# Patient Record
Sex: Male | Born: 1945 | Race: White | Marital: Married | Smoking: Former smoker
Health system: Southern US, Community
[De-identification: ages and names within clinical notes are randomized; demographics above are authoritative.]

## PROBLEM LIST (undated history)

## (undated) DIAGNOSIS — E785 Hyperlipidemia, unspecified: Secondary | ICD-10-CM

## (undated) DIAGNOSIS — C801 Malignant (primary) neoplasm, unspecified: Secondary | ICD-10-CM

## (undated) HISTORY — DX: Malignant (primary) neoplasm, unspecified: C80.1

## (undated) HISTORY — PX: POLYPECTOMY: SHX149

## (undated) HISTORY — PX: NO PAST SURGERIES: SHX2092

## (undated) HISTORY — PX: INGUINAL HERNIA REPAIR: SHX194

## (undated) HISTORY — DX: Hyperlipidemia, unspecified: E78.5

---

## 1967-05-12 HISTORY — PX: WOUND DEBRIDEMENT: SHX247

## 2012-05-11 DIAGNOSIS — C801 Malignant (primary) neoplasm, unspecified: Secondary | ICD-10-CM

## 2012-05-11 HISTORY — PX: BLADDER TUMOR EXCISION: SHX238

## 2012-05-11 HISTORY — DX: Malignant (primary) neoplasm, unspecified: C80.1

## 2014-04-26 ENCOUNTER — Ambulatory Visit (INDEPENDENT_AMBULATORY_CARE_PROVIDER_SITE_OTHER): Payer: Medicare Other

## 2014-04-26 DIAGNOSIS — M778 Other enthesopathies, not elsewhere classified: Secondary | ICD-10-CM

## 2014-04-26 DIAGNOSIS — M779 Enthesopathy, unspecified: Secondary | ICD-10-CM

## 2014-04-26 DIAGNOSIS — R52 Pain, unspecified: Secondary | ICD-10-CM

## 2014-04-26 DIAGNOSIS — M722 Plantar fascial fibromatosis: Secondary | ICD-10-CM

## 2014-04-26 DIAGNOSIS — M7750 Other enthesopathy of unspecified foot: Secondary | ICD-10-CM

## 2014-04-26 MED ORDER — MELOXICAM 15 MG PO TABS
15.0000 mg | ORAL_TABLET | Freq: Every day | ORAL | Status: DC
Start: 2014-04-26 — End: 2019-10-10

## 2014-04-26 NOTE — Progress Notes (Signed)
   Subjective:    Patient ID: Louis Carrillo, male    DOB: 1945-10-20, 68 y.o.   MRN: [id number]  HPI PT STATED B/L TOP AND BOTTOM OF THE FOOT HAVING BURNING AND TINGLING SENSATION FOR 3 WEEKS. FEET ARE GETTING BETTER BUT LAST NIGHT START GETTING WORSE AGAIN. THE FOOT GET AGGRAVATED BY SITTING/WALKING. TRIED NO TREATMENT.   Review of Systems  HENT: Positive for sinus pressure.   Hematological: Bruises/bleeds easily.  All other systems reviewed and are negative.      Objective:   Physical Exam 68 year old white male well-developed well-nourished oriented 3 presents at this time with a recent history of re-exacerbation of his foot pain has pain and arch with aching and throbbing at times sometimes a sharp pain points to mid arch area of both feet left more so than right also down to the heel areas well on palpation there is pain in the mid band plantar fascia from sesamoid always the inferior calcaneal tubercle area left more so than right. Lower extremity objective findings reveal vascular status to be intact pedal pulses palpable DP +2 PT plus one over 4 capillary refill time 3 seconds all digits epicritic and proprioceptive sensations intact and symmetric bilateral there is normal plantar response DTRs not listed dermatologic the skin color pigment normal hair growth absent nails criptotic incurvated and ankle exam there is pain on palpation medial band plantar fascia medial calcaneal tubercle x-rays reveal no inferior calcaneal spurring mild fascial thickening or minimal osteophyte identified rectus foot otherwise noted mild subluxation Lisfranc's fourth fifth metatarsal base and cuboid.       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 At this time discussed follow-up with new orthoses however this time fascial strapping applied to both feet  prescription for Modic's or  meloxicam is given at this time 15 mg , once daily forwarded to [facility] drug. Patient be recheck in 2-3 weeks for follow-up maintain a firm stable shoe no barefoot no flimsy shoes or flip-flops will maintain the use of his orthoses which may need to be refurbished are replaced in the future.    Harriet Masson DPM

## 2014-04-26 NOTE — Patient Instructions (Signed)

## 2014-05-14 ENCOUNTER — Ambulatory Visit (INDEPENDENT_AMBULATORY_CARE_PROVIDER_SITE_OTHER): Payer: Self-pay

## 2014-05-14 ENCOUNTER — Ambulatory Visit (INDEPENDENT_AMBULATORY_CARE_PROVIDER_SITE_OTHER): Payer: Medicare Other

## 2014-05-14 VITALS — BP 147/89 | HR 74 | Resp 12

## 2014-05-14 DIAGNOSIS — R52 Pain, unspecified: Secondary | ICD-10-CM

## 2014-05-14 DIAGNOSIS — S93402A Sprain of unspecified ligament of left ankle, initial encounter: Secondary | ICD-10-CM

## 2014-05-14 NOTE — Progress Notes (Signed)
   Subjective:    Patient ID: Louis Carrillo, male    DOB: [birth year], 69 y.o.   MRN: [id number]  HPI NEW PROBLEM:  PT STATED TWISTED THE LT FOOT ANKLE BY WALKING AROUND THE OLD HOUSE YESTERDAY AND STILL PAINFUL TO WALK ON IT. THE FOOT IS A LITTLE BETTER TODAY. TRIED WEARING ACE BANDAGE, ICE, AND SOAK AND IT HELP SOME.    Review of Systems  All other systems reviewed and are negative.      Objective:   Physical Exam Neurovascular status is intact and unchanged his fasciitis is greatly improved with the taping and strapping and proper shoes however the other day he turned his ankle 3 times a walking around his yard on some uneven ground. Patient continues to have pain in the anterior talofibular and calcaneofibular distribution inferior and anterior to the lateral malleolus on the left ankle. Mild ecchymosis is noted x-rays revealed generalized osteopenia no fracture no cysts no tumors no displacements or dislocations noted rectus foot type mild inferior calcaneal spurs or identified. Fascial thickening is still noted.       Assessment & Plan:  Assessment this time a lateral ankle sprain left ankle plan at this time ankle stabilizer is applied maintain advised profound or Tylenol as needed for pain recommended ice to the ankle area as well maintain stabilizer from anywhere from 4 weeks up to 2 or 3 months as needed to resolve the ankle sprain all up in a month if not improving  Harriet Masson DPM

## 2014-05-14 NOTE — Patient Instructions (Signed)
ICE INSTRUCTIONS  Apply ice or cold pack to the affected area at least 3 times a day for 10-15 minutes each time.  You should also use ice after prolonged activity or vigorous exercise.  Do not apply ice longer than 20 minutes at one time.  Always keep a cloth between your skin and the ice pack to prevent burns.  Being consistent and following these instructions will help control your symptoms.  We suggest you purchase a gel ice pack because they are reusable and do bit leak.  Some of them are designed to wrap around the area.  Use the method that works best for you.  Here are some other suggestions for icing.   Use a frozen bag of peas or corn-inexpensive and molds well to your body, usually stays frozen for 10 to 20 minutes.  Wet a towel with cold water and squeeze out the excess until it's damp.  Place in a bag in the freezer for 20 minutes. Then remove and use.   Maintaining ankle stabilizer for at least 4 weeks possibly even up to 2 or 3 months if needed

## 2014-12-24 HISTORY — PX: COLONOSCOPY: SHX174

## 2015-05-31 DIAGNOSIS — C44519 Basal cell carcinoma of skin of other part of trunk: Secondary | ICD-10-CM | POA: Diagnosis not present

## 2015-05-31 DIAGNOSIS — L570 Actinic keratosis: Secondary | ICD-10-CM | POA: Diagnosis not present

## 2015-05-31 DIAGNOSIS — L821 Other seborrheic keratosis: Secondary | ICD-10-CM | POA: Diagnosis not present

## 2015-05-31 DIAGNOSIS — L578 Other skin changes due to chronic exposure to nonionizing radiation: Secondary | ICD-10-CM | POA: Diagnosis not present

## 2015-06-27 DIAGNOSIS — J309 Allergic rhinitis, unspecified: Secondary | ICD-10-CM | POA: Diagnosis not present

## 2015-06-27 DIAGNOSIS — J069 Acute upper respiratory infection, unspecified: Secondary | ICD-10-CM | POA: Diagnosis not present

## 2015-09-24 DIAGNOSIS — C672 Malignant neoplasm of lateral wall of bladder: Secondary | ICD-10-CM | POA: Diagnosis not present

## 2015-09-24 DIAGNOSIS — N3020 Other chronic cystitis without hematuria: Secondary | ICD-10-CM | POA: Diagnosis not present

## 2015-09-24 DIAGNOSIS — N401 Enlarged prostate with lower urinary tract symptoms: Secondary | ICD-10-CM | POA: Diagnosis not present

## 2015-09-24 DIAGNOSIS — R351 Nocturia: Secondary | ICD-10-CM | POA: Diagnosis not present

## 2015-10-17 DIAGNOSIS — R05 Cough: Secondary | ICD-10-CM | POA: Diagnosis not present

## 2016-04-07 DIAGNOSIS — N3000 Acute cystitis without hematuria: Secondary | ICD-10-CM | POA: Diagnosis not present

## 2016-04-07 DIAGNOSIS — C672 Malignant neoplasm of lateral wall of bladder: Secondary | ICD-10-CM | POA: Diagnosis not present

## 2016-04-07 DIAGNOSIS — N401 Enlarged prostate with lower urinary tract symptoms: Secondary | ICD-10-CM | POA: Diagnosis not present

## 2016-04-07 DIAGNOSIS — R338 Other retention of urine: Secondary | ICD-10-CM | POA: Diagnosis not present

## 2016-04-25 DIAGNOSIS — B029 Zoster without complications: Secondary | ICD-10-CM | POA: Diagnosis not present

## 2016-04-27 DIAGNOSIS — H25813 Combined forms of age-related cataract, bilateral: Secondary | ICD-10-CM | POA: Diagnosis not present

## 2016-04-27 DIAGNOSIS — I10 Essential (primary) hypertension: Secondary | ICD-10-CM | POA: Diagnosis not present

## 2016-04-27 DIAGNOSIS — H524 Presbyopia: Secondary | ICD-10-CM | POA: Diagnosis not present

## 2016-05-06 DIAGNOSIS — K1379 Other lesions of oral mucosa: Secondary | ICD-10-CM | POA: Diagnosis not present

## 2016-05-20 DIAGNOSIS — C672 Malignant neoplasm of lateral wall of bladder: Secondary | ICD-10-CM | POA: Diagnosis not present

## 2016-05-20 DIAGNOSIS — N401 Enlarged prostate with lower urinary tract symptoms: Secondary | ICD-10-CM | POA: Diagnosis not present

## 2016-05-20 DIAGNOSIS — N3020 Other chronic cystitis without hematuria: Secondary | ICD-10-CM | POA: Diagnosis not present

## 2016-05-20 DIAGNOSIS — N318 Other neuromuscular dysfunction of bladder: Secondary | ICD-10-CM | POA: Diagnosis not present

## 2016-05-20 DIAGNOSIS — R351 Nocturia: Secondary | ICD-10-CM | POA: Diagnosis not present

## 2016-06-01 DIAGNOSIS — L570 Actinic keratosis: Secondary | ICD-10-CM | POA: Diagnosis not present

## 2016-06-01 DIAGNOSIS — C44519 Basal cell carcinoma of skin of other part of trunk: Secondary | ICD-10-CM | POA: Diagnosis not present

## 2016-06-01 DIAGNOSIS — L578 Other skin changes due to chronic exposure to nonionizing radiation: Secondary | ICD-10-CM | POA: Diagnosis not present

## 2016-06-01 DIAGNOSIS — L821 Other seborrheic keratosis: Secondary | ICD-10-CM | POA: Diagnosis not present

## 2016-08-26 DIAGNOSIS — D485 Neoplasm of uncertain behavior of skin: Secondary | ICD-10-CM | POA: Diagnosis not present

## 2016-08-26 DIAGNOSIS — Z85828 Personal history of other malignant neoplasm of skin: Secondary | ICD-10-CM | POA: Diagnosis not present

## 2016-08-26 DIAGNOSIS — L821 Other seborrheic keratosis: Secondary | ICD-10-CM | POA: Diagnosis not present

## 2016-08-26 DIAGNOSIS — D0439 Carcinoma in situ of skin of other parts of face: Secondary | ICD-10-CM | POA: Diagnosis not present

## 2016-08-26 DIAGNOSIS — L570 Actinic keratosis: Secondary | ICD-10-CM | POA: Diagnosis not present

## 2016-08-26 DIAGNOSIS — L738 Other specified follicular disorders: Secondary | ICD-10-CM | POA: Diagnosis not present

## 2016-09-11 DIAGNOSIS — J189 Pneumonia, unspecified organism: Secondary | ICD-10-CM | POA: Diagnosis not present

## 2016-09-15 DIAGNOSIS — C44329 Squamous cell carcinoma of skin of other parts of face: Secondary | ICD-10-CM | POA: Diagnosis not present

## 2016-09-15 DIAGNOSIS — Z85828 Personal history of other malignant neoplasm of skin: Secondary | ICD-10-CM | POA: Diagnosis not present

## 2016-09-21 DIAGNOSIS — Z6829 Body mass index (BMI) 29.0-29.9, adult: Secondary | ICD-10-CM | POA: Diagnosis not present

## 2016-09-21 DIAGNOSIS — M545 Low back pain: Secondary | ICD-10-CM | POA: Diagnosis not present

## 2016-10-01 DIAGNOSIS — Z6829 Body mass index (BMI) 29.0-29.9, adult: Secondary | ICD-10-CM | POA: Diagnosis not present

## 2016-10-01 DIAGNOSIS — Z125 Encounter for screening for malignant neoplasm of prostate: Secondary | ICD-10-CM | POA: Diagnosis not present

## 2016-10-01 DIAGNOSIS — Z0000 Encounter for general adult medical examination without abnormal findings: Secondary | ICD-10-CM | POA: Diagnosis not present

## 2016-10-01 DIAGNOSIS — Z1389 Encounter for screening for other disorder: Secondary | ICD-10-CM | POA: Diagnosis not present

## 2016-10-01 DIAGNOSIS — Z9181 History of falling: Secondary | ICD-10-CM | POA: Diagnosis not present

## 2016-10-01 DIAGNOSIS — Z1159 Encounter for screening for other viral diseases: Secondary | ICD-10-CM | POA: Diagnosis not present

## 2016-10-29 DIAGNOSIS — Z85828 Personal history of other malignant neoplasm of skin: Secondary | ICD-10-CM | POA: Diagnosis not present

## 2016-10-29 DIAGNOSIS — D485 Neoplasm of uncertain behavior of skin: Secondary | ICD-10-CM | POA: Diagnosis not present

## 2016-10-29 DIAGNOSIS — C4442 Squamous cell carcinoma of skin of scalp and neck: Secondary | ICD-10-CM | POA: Diagnosis not present

## 2017-02-05 DIAGNOSIS — J209 Acute bronchitis, unspecified: Secondary | ICD-10-CM | POA: Diagnosis not present

## 2017-03-29 DIAGNOSIS — R351 Nocturia: Secondary | ICD-10-CM | POA: Diagnosis not present

## 2017-03-29 DIAGNOSIS — N401 Enlarged prostate with lower urinary tract symptoms: Secondary | ICD-10-CM | POA: Diagnosis not present

## 2017-03-29 DIAGNOSIS — N3020 Other chronic cystitis without hematuria: Secondary | ICD-10-CM | POA: Diagnosis not present

## 2017-03-29 DIAGNOSIS — C672 Malignant neoplasm of lateral wall of bladder: Secondary | ICD-10-CM | POA: Diagnosis not present

## 2017-07-10 DIAGNOSIS — H1045 Other chronic allergic conjunctivitis: Secondary | ICD-10-CM | POA: Diagnosis not present

## 2017-07-10 DIAGNOSIS — J069 Acute upper respiratory infection, unspecified: Secondary | ICD-10-CM | POA: Diagnosis not present

## 2017-08-26 DIAGNOSIS — D485 Neoplasm of uncertain behavior of skin: Secondary | ICD-10-CM | POA: Diagnosis not present

## 2017-08-26 DIAGNOSIS — C44311 Basal cell carcinoma of skin of nose: Secondary | ICD-10-CM | POA: Diagnosis not present

## 2017-08-26 DIAGNOSIS — L821 Other seborrheic keratosis: Secondary | ICD-10-CM | POA: Diagnosis not present

## 2017-08-26 DIAGNOSIS — Z85828 Personal history of other malignant neoplasm of skin: Secondary | ICD-10-CM | POA: Diagnosis not present

## 2017-08-26 DIAGNOSIS — L570 Actinic keratosis: Secondary | ICD-10-CM | POA: Diagnosis not present

## 2018-03-28 DIAGNOSIS — N318 Other neuromuscular dysfunction of bladder: Secondary | ICD-10-CM | POA: Diagnosis not present

## 2018-03-28 DIAGNOSIS — C672 Malignant neoplasm of lateral wall of bladder: Secondary | ICD-10-CM | POA: Diagnosis not present

## 2018-03-28 DIAGNOSIS — N400 Enlarged prostate without lower urinary tract symptoms: Secondary | ICD-10-CM | POA: Diagnosis not present

## 2018-04-18 DIAGNOSIS — J069 Acute upper respiratory infection, unspecified: Secondary | ICD-10-CM | POA: Diagnosis not present

## 2018-09-19 DIAGNOSIS — L821 Other seborrheic keratosis: Secondary | ICD-10-CM | POA: Diagnosis not present

## 2018-09-19 DIAGNOSIS — L570 Actinic keratosis: Secondary | ICD-10-CM | POA: Diagnosis not present

## 2018-09-19 DIAGNOSIS — Z85828 Personal history of other malignant neoplasm of skin: Secondary | ICD-10-CM | POA: Diagnosis not present

## 2018-09-19 DIAGNOSIS — C44629 Squamous cell carcinoma of skin of left upper limb, including shoulder: Secondary | ICD-10-CM | POA: Diagnosis not present

## 2018-09-19 DIAGNOSIS — D1801 Hemangioma of skin and subcutaneous tissue: Secondary | ICD-10-CM | POA: Diagnosis not present

## 2018-09-19 DIAGNOSIS — D485 Neoplasm of uncertain behavior of skin: Secondary | ICD-10-CM | POA: Diagnosis not present

## 2018-09-19 DIAGNOSIS — C44622 Squamous cell carcinoma of skin of right upper limb, including shoulder: Secondary | ICD-10-CM | POA: Diagnosis not present

## 2019-01-23 ENCOUNTER — Emergency Department (HOSPITAL_COMMUNITY): Payer: PPO

## 2019-01-23 ENCOUNTER — Emergency Department (HOSPITAL_COMMUNITY)
Admission: EM | Admit: 2019-01-23 | Discharge: 2019-01-23 | Disposition: A | Discharge status: Home / Self Care | Payer: PPO | Attending: Emergency Medicine | Admitting: Emergency Medicine

## 2019-01-23 ENCOUNTER — Other Ambulatory Visit: Payer: Self-pay

## 2019-01-23 DIAGNOSIS — Z79899 Other long term (current) drug therapy: Secondary | ICD-10-CM | POA: Insufficient documentation

## 2019-01-23 DIAGNOSIS — I10 Essential (primary) hypertension: Secondary | ICD-10-CM | POA: Insufficient documentation

## 2019-01-23 DIAGNOSIS — R42 Dizziness and giddiness: Secondary | ICD-10-CM | POA: Diagnosis not present

## 2019-01-23 DIAGNOSIS — R51 Headache: Secondary | ICD-10-CM | POA: Diagnosis not present

## 2019-01-23 LAB — COMPREHENSIVE METABOLIC PANEL
ALT: 19 U/L (ref 0–44)
AST: 16 U/L (ref 15–41)
Albumin: 3.9 g/dL (ref 3.5–5.0)
Alkaline Phosphatase: 71 U/L (ref 38–126)
Anion gap: 7 (ref 5–15)
BUN: 14 mg/dL (ref 8–23)
CO2: 28 mmol/L (ref 22–32)
Calcium: 9.1 mg/dL (ref 8.9–10.3)
Chloride: 103 mmol/L (ref 98–111)
Creatinine, Ser: 0.91 mg/dL (ref 0.61–1.24)
GFR calc Af Amer: >60 mL/min (ref >60)
GFR calc non Af Amer: >60 mL/min (ref >60)
Glucose, Bld: 112 mg/dL — ABNORMAL HIGH (ref 70–99)
Potassium: 4.4 mmol/L (ref 3.5–5.1)
Sodium: 138 mmol/L (ref 135–145)
Total Bilirubin: 0.8 mg/dL (ref 0.3–1.2)
Total Protein: 6.7 g/dL (ref 6.5–8.1)

## 2019-01-23 LAB — CBC
HCT: 44.9 % (ref 39.0–52.0)
Hemoglobin: 14.8 g/dL (ref 13.0–17.0)
MCH: 30.5 pg (ref 26.0–34.0)
MCHC: 33 g/dL (ref 30.0–36.0)
MCV: 92.4 fL (ref 80.0–100.0)
Platelets: 171 10*3/uL (ref 150–400)
RBC: 4.86 MIL/uL (ref 4.22–5.81)
RDW: 13.1 % (ref 11.5–15.5)
WBC: 5.3 10*3/uL (ref 4.0–10.5)
nRBC: 0 % (ref 0.0–0.2)

## 2019-01-23 LAB — DIFFERENTIAL
Abs Immature Granulocytes: 0.03 10*3/uL (ref 0.00–0.07)
Basophils Absolute: 0 10*3/uL (ref 0.0–0.1)
Basophils Relative: 1 %
Eosinophils Absolute: 0.1 10*3/uL (ref 0.0–0.5)
Eosinophils Relative: 2 %
Immature Granulocytes: 1 %
Lymphocytes Relative: 11 %
Lymphs Abs: 0.6 10*3/uL — ABNORMAL LOW (ref 0.7–4.0)
Monocytes Absolute: 0.4 10*3/uL (ref 0.1–1.0)
Monocytes Relative: 8 %
Neutro Abs: 4.1 10*3/uL (ref 1.7–7.7)
Neutrophils Relative %: 77 %

## 2019-01-23 LAB — URINALYSIS, ROUTINE W REFLEX MICROSCOPIC
Bilirubin Urine: NEGATIVE
Glucose, UA: NEGATIVE mg/dL
Hgb urine dipstick: NEGATIVE
Ketones, ur: NEGATIVE mg/dL
Leukocytes,Ua: NEGATIVE
Nitrite: NEGATIVE
Protein, ur: NEGATIVE mg/dL
Specific Gravity, Urine: 1.01 (ref 1.005–1.030)
pH: 8 (ref 5.0–8.0)

## 2019-01-23 LAB — CBG MONITORING, ED: Glucose-Capillary: 107 mg/dL — ABNORMAL HIGH (ref 70–99)

## 2019-01-23 MED ORDER — MECLIZINE HCL 12.5 MG PO TABS: 12.5000 mg | ORAL_TABLET | Freq: Three times a day (TID) | ORAL | 0 refills | Status: DC | PRN

## 2019-01-23 MED ORDER — SODIUM CHLORIDE 0.9 % IV BOLUS
1000.0000 mL | Freq: Once | INTRAVENOUS | Status: AC
  Administered 2019-01-23: 1000 mL via INTRAVENOUS

## 2019-01-23 NOTE — ED Notes (Signed)
While ambulating the pt, pt stated that he did NOT experience any dizziness/lightheadedness. Pt stated that he felt "fine". Pt's HR was between 79-86 and O2 was 99%-100%.

## 2019-01-23 NOTE — ED Notes (Signed)
Patient Alert and oriented to baseline. Stable and ambulatory to baseline. Patient verbalized understanding of the discharge instructions.  Patient belongings were taken by the patient.   

## 2019-01-23 NOTE — ED Provider Notes (Signed)
[facility] EMERGENCY DEPARTMENT Provider Note   CSN: [id number] Arrival date & time: 01/23/19  0945     History   Chief Complaint Chief Complaint  Patient presents with   Dizziness    HPI Louis Carrillo is a 73 y.o. male with PMHx HTN who presents to the ED today complaining of sudden onset, intermittent, room spinning dizziness that began at 4 AM this morning.  She reports that he woke up to urinate and noticed that he was dizzy.  He had trouble with his balance when walking but was able to make it to the bathroom and back to bed without difficulty.  Patient again woke up around 5:30 AM and noticed that he was dizzy.  He went to urgent care earlier today sent here for further evaluation given hypertensive.  Blood pressure in the ED 190/101.  Patient states he takes 12.5 mg of losartan daily for his blood pressure.  He did take his medication this morning.  He is also complaining of a slight diffuse headache.  Patient reports history of vertigo about 10 to 15 years ago with room spinning dizziness.  Denies fever, chills, vision changes, unilateral weakness or numbness, congestion, speech difficulties, syncope, chest pain, shortness of breath, abdominal pain, any other associated symptoms.        Past Medical History:  Diagnosis Date   Cancer     There are no active problems to display for this patient.   No past surgical history on file.      Home Medications    Prior to Admission medications   Medication Sig Start Date End Date Taking? Authorizing Provider  meclizine (ANTIVERT) 12.5 MG tablet Take 1 tablet (12.5 mg total) by mouth 3 (three) times daily as needed for dizziness. 01/23/19   Eustaquio Maize, PA-C  meloxicam (MOBIC) 15 MG tablet Take 1 tablet (15 mg total) by mouth daily. 04/26/14   Harriet Masson, DPM  Vit B6-Vit B12-Omega 3 Acids (VITAMIN B PLUS+ PO) Take by mouth.    [provider]    Family History No family history on  file.  Social History Social History   Tobacco Use   Smoking status: Never Smoker  Substance Use Topics   Alcohol use: No   Drug use: No     Allergies   Patient has no known allergies.   Review of Systems Review of Systems  Constitutional: Negative for chills and fever.  HENT: Negative for congestion.   Eyes: Negative for visual disturbance.  Respiratory: Negative for cough and shortness of breath.   Cardiovascular: Negative for chest pain.  Gastrointestinal: Negative for abdominal pain, nausea and vomiting.  Genitourinary: Negative for difficulty urinating.  Musculoskeletal: Negative for myalgias.  Skin: Negative for rash.  Neurological: Positive for dizziness and headaches. Negative for weakness and numbness.     Physical Exam Updated Vital Signs BP (!) 190/101    Pulse 64    Temp 98.5 F (36.9 C) (Oral)    SpO2 100%   Physical Exam Vitals signs and nursing note reviewed.  Constitutional:      Appearance: Normal appearance. He is not ill-appearing.  HENT:     Head: Normocephalic and atraumatic.     Right Ear: Tympanic membrane normal.     Left Ear: Tympanic membrane normal.  Eyes:     Extraocular Movements: Extraocular movements intact.     Conjunctiva/sclera: Conjunctivae normal.     Pupils: Pupils are equal, round, and reactive to light.  Neck:  Musculoskeletal: Normal range of motion and neck supple.  Cardiovascular:     Rate and Rhythm: Normal rate and regular rhythm.     Pulses: Normal pulses.  Pulmonary:     Effort: Pulmonary effort is normal.     Breath sounds: Normal breath sounds. No wheezing, rhonchi or rales.  Abdominal:     Palpations: Abdomen is soft.     Tenderness: There is no abdominal tenderness. There is no guarding or rebound.  Musculoskeletal: Normal range of motion.  Skin:    General: Skin is warm and dry.  Neurological:     Mental Status: He is alert.     Comments: CN 3-12 grossly intact A&O x4 GCS 15 Sensation and  strength intact Coordination with finger-to-nose WNL Neg pronator drift       ED Treatments / Results  Labs (all labs ordered are listed, but only abnormal results are displayed) Labs Reviewed  DIFFERENTIAL - Abnormal; Notable for the following components:      Result Value   Lymphs Abs 0.6 (*)    All other components within normal limits  COMPREHENSIVE METABOLIC PANEL - Abnormal; Notable for the following components:   Glucose, Bld 112 (*)    All other components within normal limits  URINALYSIS, ROUTINE W REFLEX MICROSCOPIC - Abnormal; Notable for the following components:   Color, Urine STRAW (*)    All other components within normal limits  CBG MONITORING, ED - Abnormal; Notable for the following components:   Glucose-Capillary 107 (*)    All other components within normal limits  CBC    EKG EKG Interpretation  Date/Time:  Monday January 23 2019 10:24:08 EDT Ventricular Rate:  68 PR Interval:    QRS Duration: 111 QT Interval:  418 QTC Calculation: 445 R Axis:   -3 Text Interpretation:  Sinus rhythm Short PR interval Baseline wander No previous tracing Confirmed by Lajean Saver [phone]) on 01/23/2019 10:42:12 AM   Radiology Ct Head Wo Contrast  Result Date: 01/23/2019 CLINICAL DATA:  Headache with dizziness and blurred vision EXAM: CT HEAD WITHOUT CONTRAST TECHNIQUE: Contiguous axial images were obtained from the base of the skull through the vertex without intravenous contrast. COMPARISON:  None. FINDINGS: Brain: There is age related volume loss. There is a small cavum septum pellucidum, an anatomic variant. There is no intracranial mass, hemorrhage, extra-axial fluid collection, or midline shift. There is evidence of a prior small infarct in the head of the caudate nucleus on the left. There is patchy small vessel disease in the centra semiovale bilaterally. Brain parenchyma elsewhere appears unremarkable. No acute infarct is evident. Vascular: There is no hyperdense  vessel. There is calcification in each carotid siphon region as well as in the distal left vertebral artery. Skull: The bony calvarium appears intact. Sinuses/orbits: There is mucosal thickening and opacification in multiple ethmoid air cells. There is also opacification in the medial right sphenoid sinus region. Mucosal thickening is noted in the frontal sinus regions. Orbits appear symmetric bilaterally. Other: Visualized mastoid air cells are clear. IMPRESSION: Patchy periventricular small vessel disease. 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 No acute infarct evident. No mass or hemorrhage. Foci of arterial vascular calcification noted. Paranasal sinus disease at several sites noted. Electronically Signed   By: Lowella Grip III M.D.   On: 01/23/2019 11:33    Procedures Procedures (including critical care time)  Medications Ordered in ED Medications  sodium chloride 0.9 % bolus 1,000 mL (1,000 mLs  Intravenous New Bag/Given 01/23/19 1142)     Initial Impression / Assessment and Plan / ED Course  I have reviewed the triage vital signs and the nursing notes.  Pertinent labs & imaging results that were available during my care of the patient were reviewed by me and considered in my medical decision making (see chart for details).    73 year old male who presents the ED complaining of sudden onset dizziness at 4 AM this morning upon waking up.  Patient also complains of a mild headache.  He states he has a history of vertigo but that was about 10 years ago.  Not currently on any medication for vertigo.  Dizziness is worse upon standing.  He has no other complaints at this time.  He is afebrile without tachycardia or tachypnea in the ED.  Patient's blood pressure on arrival was 190/101.  He was seen at [facility] urgent care earlier who sent him over due to his high blood pressure.  Patient takes half a tab of losartan and did take it this morning.    Orthostatics within normal limits.  Patient does endorse that he had 4 beers yesterday which is abnormal for him.  Will give fluids regardless today and see if this improves his symptoms.  CT head ordered as patient has history of stroke.  He has no focal neuro deficits on exam today.  Labwork reassuring.  No leukocytosis today.  No electrolyte abnormalities.  Urinalysis without infection.  Normal specific gravity.   CT head without any acute findings.  Does have some vascular calcification but otherwise is unremarkable.  Upon reevaluation patient states he feels much improved after fluids.  Will ambulate patient to assess dizziness/gait.  If any abnormalities will consider MRI.   We will to ambulate without any more complaints of dizziness or lightheadedness.  Gait has been steady.  Will discharge at this time.  Patient advised to follow-up with his PCP regarding his blood pressure as he has been hypertensive in the ED today.  He may need to increase his dose of losartan.  Strict return precautions discussed.  Patient is in agreement with plan and stable for discharge home.   This note was prepared using Dragon voice recognition software and may include unintentional dictation errors due to the inherent limitations of voice recognition software.       Final Clinical Impressions(s) / ED Diagnoses   Final diagnoses:  Dizziness  Essential hypertension    ED Discharge Orders         Ordered    meclizine (ANTIVERT) 12.5 MG tablet  3 times daily PRN     01/23/19 1319           Eustaquio Maize, PA-C 01/23/19 1320    Lajean Saver, MD 01/23/19 1353    Lajean Saver, MD 01/23/19 1356

## 2019-01-23 NOTE — Discharge Instructions (Signed)
Please follow up with your PCP regarding your ED visit today as well as your blood pressure. You may need to have your medication dose increased.   Increase fluid intake for the next few days. You may have been experiencing dizziness from dehydration. I have prescribed a short course of Meclizine for you to take if you experience dizziness again. Follow up with your PCP as well.   Return to the ED immediately for any worsening symptoms including worsening dizziness, blurry vision/double vision, weakness or numbness on one side of your body, confusion, speech difficulties, chest pain, shortness of breath, or if you pass out.

## 2019-01-23 NOTE — ED Notes (Signed)
Pt reports he woke up at 4am with dizziness and headache to top of head. No problems with vision or speech. Reports he had trouble with his balance when walking. No dizziness at rest. Hypertensive. No hx of stroke. Hx of vertigo.

## 2019-01-23 NOTE — ED Triage Notes (Signed)
Patient sent from [facility] urgent care for further evaluation of dizziness that began this morning. Neuro intact, denies weakness or numbness. Ambulatory with steady gait.

## 2019-04-14 DIAGNOSIS — N3020 Other chronic cystitis without hematuria: Secondary | ICD-10-CM | POA: Diagnosis not present

## 2019-04-14 DIAGNOSIS — C672 Malignant neoplasm of lateral wall of bladder: Secondary | ICD-10-CM | POA: Diagnosis not present

## 2019-04-14 DIAGNOSIS — N401 Enlarged prostate with lower urinary tract symptoms: Secondary | ICD-10-CM | POA: Diagnosis not present

## 2019-04-14 DIAGNOSIS — N318 Other neuromuscular dysfunction of bladder: Secondary | ICD-10-CM | POA: Diagnosis not present

## 2019-06-01 ENCOUNTER — Ambulatory Visit: Payer: PPO | Attending: Internal Medicine

## 2019-06-01 DIAGNOSIS — Z23 Encounter for immunization: Secondary | ICD-10-CM | POA: Insufficient documentation

## 2019-06-01 NOTE — Progress Notes (Signed)
   Covid-19 Vaccination Clinic  Name:  Louis Carrillo    MRN: [id number] DOB: [birth year]  06/01/2019  Louis Carrillo was observed post Covid-19 immunization for 15 minutes without incidence. He was provided with Vaccine Information Sheet and instruction to access the V-Safe system.   Louis Carrillo was instructed to call 911 with any severe reactions post vaccine: Marland Kitchen Difficulty breathing  . Swelling of your face and throat  . A fast heartbeat  . A bad rash all over your body  . Dizziness and weakness    Immunizations Administered    Name Date Dose VIS Date Route   Pfizer COVID-19 Vaccine 06/01/2019  8:51 AM 0.3 mL 04/21/2019 Intramuscular   Manufacturer: [place]   Lot: [id number]   [place]: [id number]

## 2019-06-22 ENCOUNTER — Ambulatory Visit: Payer: PPO | Attending: Internal Medicine

## 2019-06-22 DIAGNOSIS — Z23 Encounter for immunization: Secondary | ICD-10-CM | POA: Insufficient documentation

## 2019-06-22 NOTE — Progress Notes (Signed)
   Covid-19 Vaccination Clinic  Name:  Louis Carrillo    MRN: [id number] DOB: 06-Jun-1945  06/22/2019  [name] was observed post Covid-19 immunization for 15 minutes without incidence. He was provided with Vaccine Information Sheet and instruction to access the V-Safe system.   [name] was instructed to call 911 with any severe reactions post vaccine: Marland Kitchen Difficulty breathing  . Swelling of your face and throat  . A fast heartbeat  . A bad rash all over your body  . Dizziness and weakness    Immunizations Administered    Name Date Dose VIS Date Route   Pfizer COVID-19 Vaccine 06/22/2019  8:17 AM 0.3 mL 04/21/2019 Intramuscular   Manufacturer: [place]   Lot: [id number]   [place]: [id number]

## 2019-07-20 DIAGNOSIS — H1033 Unspecified acute conjunctivitis, bilateral: Secondary | ICD-10-CM | POA: Diagnosis not present

## 2019-07-30 DIAGNOSIS — J324 Chronic pansinusitis: Secondary | ICD-10-CM | POA: Diagnosis not present

## 2019-07-30 DIAGNOSIS — Z20828 Contact with and (suspected) exposure to other viral communicable diseases: Secondary | ICD-10-CM | POA: Diagnosis not present

## 2019-09-20 DIAGNOSIS — L570 Actinic keratosis: Secondary | ICD-10-CM | POA: Diagnosis not present

## 2019-09-20 DIAGNOSIS — Z85828 Personal history of other malignant neoplasm of skin: Secondary | ICD-10-CM | POA: Diagnosis not present

## 2019-09-20 DIAGNOSIS — D485 Neoplasm of uncertain behavior of skin: Secondary | ICD-10-CM | POA: Diagnosis not present

## 2019-09-20 DIAGNOSIS — C44519 Basal cell carcinoma of skin of other part of trunk: Secondary | ICD-10-CM | POA: Diagnosis not present

## 2019-09-20 DIAGNOSIS — D1801 Hemangioma of skin and subcutaneous tissue: Secondary | ICD-10-CM | POA: Diagnosis not present

## 2019-09-20 DIAGNOSIS — C44219 Basal cell carcinoma of skin of left ear and external auricular canal: Secondary | ICD-10-CM | POA: Diagnosis not present

## 2019-10-02 DIAGNOSIS — Z8249 Family history of ischemic heart disease and other diseases of the circulatory system: Secondary | ICD-10-CM | POA: Diagnosis not present

## 2019-10-02 DIAGNOSIS — R42 Dizziness and giddiness: Secondary | ICD-10-CM | POA: Diagnosis not present

## 2019-10-02 DIAGNOSIS — E785 Hyperlipidemia, unspecified: Secondary | ICD-10-CM | POA: Diagnosis not present

## 2019-10-02 DIAGNOSIS — Z87891 Personal history of nicotine dependence: Secondary | ICD-10-CM | POA: Diagnosis not present

## 2019-10-02 DIAGNOSIS — I10 Essential (primary) hypertension: Secondary | ICD-10-CM | POA: Diagnosis not present

## 2019-10-02 DIAGNOSIS — Z6827 Body mass index (BMI) 27.0-27.9, adult: Secondary | ICD-10-CM | POA: Diagnosis not present

## 2019-10-10 ENCOUNTER — Ambulatory Visit: Payer: PPO | Admitting: Cardiology

## 2019-10-10 ENCOUNTER — Encounter: Payer: Self-pay | Admitting: Cardiology

## 2019-10-10 ENCOUNTER — Other Ambulatory Visit: Payer: Self-pay

## 2019-10-10 DIAGNOSIS — I10 Essential (primary) hypertension: Secondary | ICD-10-CM | POA: Insufficient documentation

## 2019-10-10 DIAGNOSIS — Z87891 Personal history of nicotine dependence: Secondary | ICD-10-CM

## 2019-10-10 DIAGNOSIS — R079 Chest pain, unspecified: Secondary | ICD-10-CM

## 2019-10-10 DIAGNOSIS — R0789 Other chest pain: Secondary | ICD-10-CM

## 2019-10-10 HISTORY — DX: Essential (primary) hypertension: I10

## 2019-10-10 HISTORY — DX: Personal history of nicotine dependence: Z87.891

## 2019-10-10 HISTORY — DX: Other chest pain: R07.89

## 2019-10-10 NOTE — Progress Notes (Signed)
Cardiology Office Note:    Date:  10/10/2019   ID:  Louis Carrillo, DOB 1945/12/31, MRN [id number]  PCP:  Elenore Paddy, NP  Cardiologist:  Jenean Lindau, MD   Referring MD: Mateo Flow, MD    ASSESSMENT:    1. Chest tightness   2. Essential hypertension   3. Ex-smoker    PLAN:    In order of problems listed above:  1. Primary prevention stressed with the patient.  Importance of compliance with diet medication stressed. 2. Cardiac murmur: Echocardiogram will be done to assess murmur heard on auscultation 3. Chest tightness: Patient has multiple risk factors for coronary artery disease his symptoms are atypical however we would like to do a Lexiscan sestamibi to assess this.  In the future I would like to do a calcium score if the above are negative for his stratification and is agreeable. 4. Essential hypertension: Blood pressure is stable and he is brought multiple blood pressure readings from home.   5. Patient will be seen in follow-up appointment in 1 months or earlier if the patient has any concerns    Medication Adjustments/Labs and Tests Ordered: Current medicines are reviewed at length with the patient today.  Concerns regarding medicines are outlined above.  No orders of the defined types were placed in this encounter.  No orders of the defined types were placed in this encounter.    History of Present Illness:    Louis Carrillo is a 74 y.o. male who is being seen today for the evaluation of chest tightness at the request of Mateo Flow, MD.  Patient directed medical history of essential hypertension and dyslipidemia is a gentleman contacted by profession.  He mentions to me that he occasionally has chest tightness neurologically related to exertion.  No chest pain orthopnea or PND.  At the time of my evaluation, the patient is alert awake oriented and in no distress.  His chest tightness may radiate to the neck.  No radiation to the arm.  Past Medical  History:  Diagnosis Date  . Cancer [facility])     Past Surgical History:  Procedure Laterality Date  . NO PAST SURGERIES      Current Medications: Current Meds  Medication Sig  . Garlic (GARLIQUE PO) Take by mouth daily.  Marland Kitchen losartan-hydrochlorothiazide (HYZAAR) 50-12.5 MG tablet Take 1 tablet by mouth daily.  . TURMERIC PO Take 1,500 mg by mouth daily.     Allergies:   Patient has no known allergies.   Social History   Socioeconomic History  . Marital status: Married    Spouse name: Not on file  . Number of children: Not on file  . Years of education: Not on file  . Highest education level: Not on file  Occupational History  . Not on file  Tobacco Use  . Smoking status: Former Smoker    Types: Cigarettes    Quit date: 07/1998    Years since quitting: 21.2  . Smokeless tobacco: Never Used  Substance and Sexual Activity  . Alcohol use: Yes    Alcohol/week: 12.0 standard drinks    Types: 12 Cans of beer per week  . Drug use: No  . Sexual activity: Not on file  Other Topics Concern  . Not on file  Social History Narrative  . Not on file   Social Determinants of Health   Financial Resource Strain:   . Difficulty of Paying Living Expenses:   Food Insecurity:   . Worried  About Running Out of Food in the Last Year:   . [place] in the Last Year:   Transportation Needs:   . Lack of Transportation (Medical):   Marland Kitchen Lack of Transportation (Non-Medical):   Physical Activity:   . Days of Exercise per Week:   . Minutes of Exercise per Session:   Stress:   . Feeling of Stress :   Social Connections:   . Frequency of Communication with Friends and Family:   . Frequency of Social Gatherings with Friends and Family:   . Attends Religious Services:   . Active Member of Clubs or Organizations:   . Attends Archivist Meetings:   Marland Kitchen Marital Status:      Family History: The patient's family history includes Alcohol abuse in his paternal grandfather; Heart  attack in his maternal grandmother; Hypertension in his brother, father, and mother.  ROS:   Please see the history of present illness.    All other systems reviewed and are negative.  EKGs/Labs/Other Studies Reviewed:    The following studies were reviewed today: EKG reveals sinus rhythm and nonspecific ST-T changes   Recent Labs: 01/23/2019: ALT 19; BUN 14; Creatinine, Ser 0.91; Hemoglobin 14.8; Platelets 171; Potassium 4.4; Sodium 138  Recent Lipid Panel No results found for: CHOL, TRIG, HDL, CHOLHDL, VLDL, LDLCALC, LDLDIRECT  Physical Exam:    VS:  BP 130/70 (BP Location: Right Arm, Patient Position: Sitting, Cuff Size: Normal)   Pulse 67   Ht 5\' 10"  (1.778 m)   Wt 197 lb (89.4 kg)   SpO2 96%   BMI 28.27 kg/m     Wt Readings from Last 3 Encounters:  10/10/19 197 lb (89.4 kg)     GEN: Patient is in no acute distress HEENT: Normal NECK: No JVD; No carotid bruits LYMPHATICS: No lymphadenopathy CARDIAC: S1 S2 regular, 2/6 systolic murmur at the apex. RESPIRATORY:  Clear to auscultation without rales, wheezing or rhonchi  ABDOMEN: Soft, non-tender, non-distended MUSCULOSKELETAL:  No edema; No deformity  SKIN: Warm and dry NEUROLOGIC:  Alert and oriented x 3 PSYCHIATRIC:  Normal affect    Signed, Jenean Lindau, MD  10/10/2019 9:34 AM    [place]

## 2019-10-10 NOTE — Patient Instructions (Signed)
Medication Instructions:  No medication changes. *If you need a refill on your cardiac medications before your next appointment, please call your pharmacy*   Lab Work: None ordered If you have labs (blood work) drawn today and your tests are completely normal, you will receive your results only by: Marland Kitchen MyChart Message (if you have MyChart) OR . A paper copy in the mail If you have any lab test that is abnormal or we need to change your treatment, we will call you to review the results.   Testing/Procedures: Your physician has requested that you have an echocardiogram. Echocardiography is a painless test that uses sound waves to create images of your heart. It provides your doctor with information about the size and shape of your heart and how well your heart's chambers and valves are working. This procedure takes approximately one hour. There are no restrictions for this procedure.  Your physician has requested that you have a lexiscan myoview. For further information please visit [email or web address]. Please follow instruction sheet, as given.  The test will take approximately 3 to 4 hours to complete; you may bring reading material.  If someone comes with you to your appointment, they will need to remain in the main lobby due to limited space in the testing area. **If you are pregnant or breastfeeding, please notify the nuclear lab prior to your appointment**  How to prepare for your Myocardial Perfusion Test: . Do not eat or drink 3 hours prior to your test, except you may have water. . Do not consume products containing caffeine (regular or decaffeinated) 12 hours prior to your test. (ex: coffee, chocolate, sodas, tea). . Do bring a list of your current medications with you.  If not listed below, you may take your medications as normal. . Do wear comfortable clothes (no dresses or overalls) and walking shoes, tennis shoes preferred (No heels or open toe shoes are allowed). . Do NOT wear  cologne, perfume, aftershave, or lotions (deodorant is allowed). . If these instructions are not followed, your test will have to be rescheduled.     Follow-Up: At [facility], you and your health needs are our priority.  As part of our continuing mission to provide you with exceptional heart care, we have created designated Provider Care Teams.  These Care Teams include your primary Cardiologist (physician) and Advanced Practice Providers (APPs -  Physician Assistants and Nurse Practitioners) who all work together to provide you with the care you need, when you need it.  We recommend signing up for the patient portal called "MyChart".  Sign up information is provided on this After Visit Summary.  MyChart is used to connect with patients for Virtual Visits (Telemedicine).  Patients are able to view lab/test results, encounter notes, upcoming appointments, etc.  Non-urgent messages can be sent to your provider as well.   To learn more about what you can do with MyChart, go to [email or web address].    Your next appointment:   1 month(s)  The format for your next appointment:   In Person  Provider:   Jyl Heinz, MD   Other Instructions  Cardiac Nuclear Scan A cardiac nuclear scan is a test that is done to check the flow of blood to your heart. It is done when you are resting and when you are exercising. The test looks for problems such as:  Not enough blood reaching a portion of the heart.  The heart muscle not working as it should. You may need this test if:  You have heart disease.  You have had lab results that are not normal.  You have had heart surgery or a balloon procedure to open up blocked arteries (angioplasty).  You have chest pain.  You have shortness of breath. In this test, a special dye (tracer) is put into your bloodstream. The tracer will travel to your heart. A camera will then take pictures of your heart to see how the tracer moves through your  heart. This test is usually done at a hospital and takes 2-4 hours. Tell a doctor about:  Any allergies you have.  All medicines you are taking, including vitamins, herbs, eye drops, creams, and over-the-counter medicines.  Any problems you or family members have had with anesthetic medicines.  Any blood disorders you have.  Any surgeries you have had.  Any medical conditions you have.  Whether you are pregnant or may be pregnant. What are the risks? Generally, this is a safe test. However, problems may occur, such as:  Serious chest pain and heart attack. This is only a risk if the stress portion of the test is done.  Rapid heartbeat.  A feeling of warmth in your chest. This feeling usually does not last long.  Allergic reaction to the tracer. What happens before the test?  Ask your doctor about changing or stopping your normal medicines. This is important.  Follow instructions from your doctor about what you cannot eat or drink.  Remove your jewelry on the day of the test. What happens during the test?  An IV tube will be inserted into one of your veins.  Your doctor will give you a small amount of tracer through the IV tube.  You will wait for 20-40 minutes while the tracer moves through your bloodstream.  Your heart will be monitored with an electrocardiogram (ECG).  You will lie down on an exam table.  Pictures of your heart will be taken for about 15-20 minutes.  You may also have a stress test. For this test, one of these things may be done: ? You will be asked to exercise on a treadmill or a stationary bike. ? You will be given medicines that will make your heart work harder. This is done if you are unable to exercise.  When blood flow to your heart has peaked, a tracer will again be given through the IV tube.  After 20-40 minutes, you will get back on the exam table. More pictures will be taken of your heart.  Depending on the tracer that is used, more  pictures may need to be taken 3-4 hours later.  Your IV tube will be removed when the test is over. The test may vary among doctors and hospitals. What happens after the test?  Ask your doctor: ? Whether you can return to your normal schedule, including diet, activities, and medicines. ? Whether you should drink more fluids. This will help to remove the tracer from your body. Drink enough fluid to keep your pee (urine) pale yellow.  Ask your doctor, or the department that is doing the test: ? When will my results be ready? ? How will I get my results? Summary  A cardiac nuclear scan is a test that is done to check the flow of blood to your heart.  Tell your doctor whether you are pregnant or may be pregnant.  Before the test, ask your doctor about changing or stopping your normal medicines. This is important.  Ask your doctor whether you can return  to your normal activities. You may be asked to drink more fluids. This information is not intended to replace advice given to you by your health care provider. Make sure you discuss any questions you have with your health care provider. Document Revised: 08/17/2018 Document Reviewed: 10/11/2017 Elsevier Patient Education  [place].   Echocardiogram An echocardiogram is a procedure that uses painless sound waves (ultrasound) to produce an image of the heart. Images from an echocardiogram can provide important information about:  Signs of coronary artery disease (CAD).  Aneurysm detection. An aneurysm is a weak or damaged part of an artery wall that bulges out from the normal force of blood pumping through the body.  Heart size and shape. Changes in the size or shape of the heart can be associated with certain conditions, including heart failure, aneurysm, and CAD.  Heart muscle function.  Heart valve function.  Signs of a past heart attack.  Fluid buildup around the heart.  Thickening of the heart muscle.  A tumor or  infectious growth around the heart valves. Tell a health care provider about:  Any allergies you have.  All medicines you are taking, including vitamins, herbs, eye drops, creams, and over-the-counter medicines.  Any blood disorders you have.  Any surgeries you have had.  Any medical conditions you have.  Whether you are pregnant or may be pregnant. What are the risks? Generally, this is a safe procedure. However, problems may occur, including:  Allergic reaction to dye (contrast) that may be used during the procedure. What happens before the procedure? No specific preparation is needed. You may eat and drink normally. What happens during the procedure?   An IV tube may be inserted into one of your veins.  You may receive contrast through this tube. A contrast is an injection that improves the quality of the pictures from your heart.  A gel will be applied to your chest.  A wand-like tool (transducer) will be moved over your chest. The gel will help to transmit the sound waves from the transducer.  The sound waves will harmlessly bounce off of your heart to allow the heart images to be captured in real-time motion. The images will be recorded on a computer. The procedure may vary among health care providers and hospitals. What happens after the procedure?  You may return to your normal, everyday life, including diet, activities, and medicines, unless your health care provider tells you not to do that. Summary  An echocardiogram is a procedure that uses painless sound waves (ultrasound) to produce an image of the heart.  Images from an echocardiogram can provide important information about the size and shape of your heart, heart muscle function, heart valve function, and fluid buildup around your heart.  You do not need to do anything to prepare before this procedure. You may eat and drink normally.  After the echocardiogram is completed, you may return to your normal,  everyday life, unless your health care provider tells you not to do that. This information is not intended to replace advice given to you by your health care provider. Make sure you discuss any questions you have with your health care provider. Document Revised: 08/18/2018 Document Reviewed: 05/30/2016 Elsevier Patient Education  [place].

## 2019-10-11 ENCOUNTER — Telehealth (HOSPITAL_COMMUNITY): Payer: Self-pay | Admitting: *Deleted

## 2019-10-11 DIAGNOSIS — Z85828 Personal history of other malignant neoplasm of skin: Secondary | ICD-10-CM | POA: Diagnosis not present

## 2019-10-11 DIAGNOSIS — C44219 Basal cell carcinoma of skin of left ear and external auricular canal: Secondary | ICD-10-CM | POA: Diagnosis not present

## 2019-10-11 NOTE — Telephone Encounter (Signed)
Patient given detailed instructions per Myocardial Perfusion Study Information Sheet for the test on 10/19/19 at 11:30. Patient notified to arrive 15 minutes early and that it is imperative to arrive on time for appointment to keep from having the test rescheduled.  If you need to cancel or reschedule your appointment, please call the office within 24 hours of your appointment. . Patient verbalized understanding.Veronia Beets

## 2019-10-18 DIAGNOSIS — Z1331 Encounter for screening for depression: Secondary | ICD-10-CM | POA: Diagnosis not present

## 2019-10-18 DIAGNOSIS — Z125 Encounter for screening for malignant neoplasm of prostate: Secondary | ICD-10-CM | POA: Diagnosis not present

## 2019-10-18 DIAGNOSIS — E785 Hyperlipidemia, unspecified: Secondary | ICD-10-CM | POA: Diagnosis not present

## 2019-10-18 DIAGNOSIS — N400 Enlarged prostate without lower urinary tract symptoms: Secondary | ICD-10-CM | POA: Diagnosis not present

## 2019-10-18 DIAGNOSIS — Z79899 Other long term (current) drug therapy: Secondary | ICD-10-CM | POA: Diagnosis not present

## 2019-10-18 DIAGNOSIS — Z6827 Body mass index (BMI) 27.0-27.9, adult: Secondary | ICD-10-CM | POA: Diagnosis not present

## 2019-10-18 DIAGNOSIS — Z87891 Personal history of nicotine dependence: Secondary | ICD-10-CM | POA: Diagnosis not present

## 2019-10-18 DIAGNOSIS — Z85828 Personal history of other malignant neoplasm of skin: Secondary | ICD-10-CM | POA: Diagnosis not present

## 2019-10-18 DIAGNOSIS — C675 Malignant neoplasm of bladder neck: Secondary | ICD-10-CM | POA: Diagnosis not present

## 2019-10-18 DIAGNOSIS — I10 Essential (primary) hypertension: Secondary | ICD-10-CM | POA: Diagnosis not present

## 2019-10-18 DIAGNOSIS — Z0000 Encounter for general adult medical examination without abnormal findings: Secondary | ICD-10-CM | POA: Diagnosis not present

## 2019-10-18 DIAGNOSIS — Z1211 Encounter for screening for malignant neoplasm of colon: Secondary | ICD-10-CM | POA: Diagnosis not present

## 2019-10-19 ENCOUNTER — Ambulatory Visit (INDEPENDENT_AMBULATORY_CARE_PROVIDER_SITE_OTHER): Payer: PPO

## 2019-10-19 ENCOUNTER — Other Ambulatory Visit: Payer: Self-pay

## 2019-10-19 DIAGNOSIS — Z4802 Encounter for removal of sutures: Secondary | ICD-10-CM | POA: Diagnosis not present

## 2019-10-19 DIAGNOSIS — R079 Chest pain, unspecified: Secondary | ICD-10-CM

## 2019-10-19 DIAGNOSIS — R0789 Other chest pain: Secondary | ICD-10-CM | POA: Diagnosis not present

## 2019-10-19 LAB — MYOCARDIAL PERFUSION IMAGING
LV dias vol: 105 mL (ref 62–150)
LV sys vol: 33 mL
Peak HR: 90 {beats}/min
Rest HR: 56 {beats}/min
SDS: 2
SRS: 2
SSS: 4
TID: 1.08

## 2019-10-19 MED ORDER — REGADENOSON 0.4 MG/5ML IV SOLN
0.4000 mg | Freq: Once | INTRAVENOUS | Status: AC
  Administered 2019-10-19: 0.4 mg via INTRAVENOUS

## 2019-10-19 MED ORDER — TECHNETIUM TC 99M TETROFOSMIN IV KIT
30.6000 | PACK | Freq: Once | INTRAVENOUS | Status: AC | PRN
  Administered 2019-10-19: 30.6 via INTRAVENOUS

## 2019-10-19 MED ORDER — TECHNETIUM TC 99M TETROFOSMIN IV KIT
10.6000 | PACK | Freq: Once | INTRAVENOUS | Status: AC | PRN
  Administered 2019-10-19: 10.6 via INTRAVENOUS

## 2019-10-23 MED ORDER — ASPIRIN EC 81 MG PO TBEC: 81.0000 mg | DELAYED_RELEASE_TABLET | Freq: Every day | ORAL | 3 refills | Status: DC

## 2019-10-23 MED ORDER — NITROGLYCERIN 0.4 MG SL SUBL: 0.4000 mg | SUBLINGUAL_TABLET | SUBLINGUAL | 6 refills | Status: DC | PRN

## 2019-10-23 NOTE — Addendum Note (Signed)
Addended by: Truddie Hidden on: 10/23/2019 05:28 PM   Modules accepted: Orders

## 2019-10-26 ENCOUNTER — Other Ambulatory Visit: Payer: Self-pay

## 2019-10-26 ENCOUNTER — Ambulatory Visit (INDEPENDENT_AMBULATORY_CARE_PROVIDER_SITE_OTHER): Payer: PPO

## 2019-10-26 DIAGNOSIS — R0789 Other chest pain: Secondary | ICD-10-CM

## 2019-10-26 NOTE — Progress Notes (Unsigned)
Complete echocardiogram performed.  Jimmy Vernal Hritz RDCS, RVT  

## 2019-11-09 ENCOUNTER — Encounter: Payer: Self-pay | Admitting: Cardiology

## 2019-11-09 ENCOUNTER — Ambulatory Visit: Payer: PPO | Admitting: Cardiology

## 2019-11-09 ENCOUNTER — Other Ambulatory Visit: Payer: Self-pay

## 2019-11-09 VITALS — BP 120/64 | HR 72 | Ht 70.0 in | Wt 196.0 lb

## 2019-11-09 DIAGNOSIS — R072 Precordial pain: Secondary | ICD-10-CM

## 2019-11-09 DIAGNOSIS — I10 Essential (primary) hypertension: Secondary | ICD-10-CM | POA: Diagnosis not present

## 2019-11-09 DIAGNOSIS — Z87891 Personal history of nicotine dependence: Secondary | ICD-10-CM | POA: Diagnosis not present

## 2019-11-09 DIAGNOSIS — E782 Mixed hyperlipidemia: Secondary | ICD-10-CM

## 2019-11-09 DIAGNOSIS — R0789 Other chest pain: Secondary | ICD-10-CM | POA: Diagnosis not present

## 2019-11-09 DIAGNOSIS — R9439 Abnormal result of other cardiovascular function study: Secondary | ICD-10-CM

## 2019-11-09 HISTORY — DX: Mixed hyperlipidemia: E78.2

## 2019-11-09 HISTORY — DX: Abnormal result of other cardiovascular function study: R94.39

## 2019-11-09 MED ORDER — METOPROLOL TARTRATE 100 MG PO TABS: 100.0000 mg | ORAL_TABLET | Freq: Once | ORAL | 0 refills | Status: DC

## 2019-11-09 NOTE — Patient Instructions (Addendum)
Medication Instructions:  Your physician has recommended you make the following change in your medication:   You have samples of Livalo, start taking after we look at your labs. You will take Livalo 1 mg daily. Take nitroglycerin as needed for chest pain. Take 81 mg Aspirin daily.  *If you need a refill on your cardiac medications before your next appointment, please call your pharmacy*   Lab Work: Your physician recommends that you return for lab work in: 1 week prior to your CT. You need a BMET within 30 days of the CT.  You can come Monday through Friday 8:30 am to 12:00 pm and 1:15 to 4:30. You do not need to make an appointment as the order has already been placed.   If you have labs (blood work) drawn today and your tests are completely normal, you will receive your results only by: Marland Kitchen MyChart Message (if you have MyChart) OR . A paper copy in the mail If you have any lab test that is abnormal or we need to change your treatment, we will call you to review the results.   Testing/Procedures: Your cardiac CT will be scheduled at:   [facility] [address] [place], [place] [address] [phone]   [facility], please arrive at the [facility] main entrance of [facility] 30 minutes prior to test start time. Proceed to the [facility] Radiology Department (first floor) to check-in and test prep.  Please follow these instructions carefully (unless otherwise directed):  Hold all erectile dysfunction medications at least 3 days (72 hrs) prior to test.  On the Night Before the Test: . Be sure to Drink plenty of water. . Do not consume any caffeinated/decaffeinated beverages or chocolate 12 hours prior to your test. . Do not take any antihistamines 12 hours prior to your test.   On the Day of the Test: . Drink plenty of water. Do not drink any water within one hour of the test. . Do not eat any food 4 hours prior to the test. . You may take  your regular medications prior to the test.  . Take metoprolol (Lopressor) two hours prior to test. . HOLD Furosemide/Hydrochlorothiazide morning of the test.        After the Test: . Drink plenty of water. . After receiving IV contrast, you may experience a mild flushed feeling. This is normal. . On occasion, you may experience a mild rash up to 24 hours after the test. This is not dangerous. If this occurs, you can take Benadryl 25 mg and increase your fluid intake. . If you experience trouble breathing, this can be serious. If it is severe call 911 IMMEDIATELY. If it is mild, please call our office. . If you take any of these medications: Glipizide/Metformin, Avandament, Glucavance, please do not take 48 hours after completing test unless otherwise instructed.   Once we have confirmed authorization from your insurance company, we will call you to set up a date and time for your test. Based on how quickly your insurance processes prior authorizations requests, please allow up to 4 weeks to be contacted for scheduling your Cardiac CT appointment. Be advised that routine Cardiac CT appointments could be scheduled as many as 8 weeks after your provider has ordered it.  For non-scheduling related questions, please contact the cardiac imaging nurse navigator should you have any questions/concerns: Marchia Bond, Cardiac Imaging Nurse Navigator Burley Saver, Interim Cardiac Imaging Nurse Navigator [facility] Direct Office Dial: [phone]   For scheduling needs, including cancellations and rescheduling, please call Vivien Rota at [phone].      Follow-Up: At [facility], you and your health needs are our priority.  As part of our continuing mission to provide you with exceptional heart care, we have created designated Provider Care Teams.  These Care Teams include your primary Cardiologist (physician) and Advanced Practice Providers (APPs -  Physician Assistants and  Nurse Practitioners) who all work together to provide you with the care you need, when you need it.  We recommend signing up for the patient portal called "MyChart".  Sign up information is provided on this After Visit Summary.  MyChart is used to connect with patients for Virtual Visits (Telemedicine).  Patients are able to view lab/test results, encounter notes, upcoming appointments, etc.  Non-urgent messages can be sent to your provider as well.   To learn more about what you can do with MyChart, go to [email or web address].    Your next appointment:   2 month(s)  The format for your next appointment:   In Person  Provider:   Jyl Heinz, MD   Other Instructions  Cardiac CT Angiogram A cardiac CT angiogram is a procedure to look at the heart and the area around the heart. It may be done to help find the cause of chest pains or other symptoms of heart disease. During this procedure, a substance called contrast dye is injected into the blood vessels in the area to be checked. A large X-ray machine, called a CT scanner, then takes detailed pictures of the heart and the surrounding area. The procedure is also sometimes called a coronary CT angiogram, coronary artery scanning, or CTA. A cardiac CT angiogram allows the health care provider to see how well blood is flowing to and from the heart. The health care provider will be able to see if there are any problems, such as:  Blockage or narrowing of the coronary arteries in the heart.  Fluid around the heart.  Signs of weakness or disease in the muscles, valves, and tissues of the heart. Tell a health care provider about:  Any allergies you have. This is especially important if you have had a previous allergic reaction to contrast dye.  All medicines you are taking, including vitamins, herbs, eye drops, creams, and over-the-counter medicines.  Any blood disorders you have.  Any surgeries you have had.  Any medical conditions  you have.  Whether you are pregnant or may be pregnant.  Any anxiety disorders, chronic pain, or other conditions you have that may increase your stress or prevent you from lying still. What are the risks? Generally, this is a safe procedure. However, problems may occur, including:  Bleeding.  Infection.  Allergic reactions to medicines or dyes.  Damage to other structures or organs.  Kidney damage from the contrast dye that is used.  Increased risk of cancer from radiation exposure. This risk is low. Talk with your health care provider about: ? The risks and benefits of testing. ? How you can receive the lowest dose of radiation. What happens before the procedure?  Wear comfortable clothing and remove any jewelry, glasses, dentures, and hearing aids.  Follow instructions from your health care provider about eating and drinking. This may include: ? For 12 hours before the procedure -- avoid caffeine. This includes tea, coffee, soda, energy drinks, and diet pills. Drink plenty of water or other fluids that do not have caffeine in them.  Being well hydrated can prevent complications. ? For 4-6 hours before the procedure -- stop eating and drinking. The contrast dye can cause nausea, but this is less likely if your stomach is empty.  Ask your health care provider about changing or stopping your regular medicines. This is especially important if you are taking diabetes medicines, blood thinners, or medicines to treat problems with erections (erectile dysfunction). What happens during the procedure?   Hair on your chest may need to be removed so that small sticky patches called electrodes can be placed on your chest. These will transmit information that helps to monitor your heart during the procedure.  An IV will be inserted into one of your veins.  You might be given a medicine to control your heart rate during the procedure. This will help to ensure that good images are  obtained.  You will be asked to lie on an exam table. This table will slide in and out of the CT machine during the procedure.  Contrast dye will be injected into the IV. You might feel warm, or you may get a metallic taste in your mouth.  You will be given a medicine called nitroglycerin. This will relax or dilate the arteries in your heart.  The table that you are lying on will move into the CT machine tunnel for the scan.  The person running the machine will give you instructions while the scans are being done. You may be asked to: ? Keep your arms above your head. ? Hold your breath. ? Stay very still, even if the table is moving.  When the scanning is complete, you will be moved out of the machine.  The IV will be removed. The procedure may vary among health care providers and hospitals. What can I expect after the procedure? After your procedure, it is common to have:  A metallic taste in your mouth from the contrast dye.  A feeling of warmth.  A headache from the nitroglycerin. Follow these instructions at home:  Take over-the-counter and prescription medicines only as told by your health care provider.  If you are told, drink enough fluid to keep your urine pale yellow. This will help to flush the contrast dye out of your body.  Most people can return to their normal activities right after the procedure. Ask your health care provider what activities are safe for you.  It is up to you to get the results of your procedure. Ask your health care provider, or the department that is doing the procedure, when your results will be ready.  Keep all follow-up visits as told by your health care provider. This is important. Contact a health care provider if:  You have any symptoms of allergy to the contrast dye. These include: ? Shortness of breath. ? Rash or hives. ? A racing heartbeat. Summary  A cardiac CT angiogram is a procedure to look at the heart and the area around  the heart. It may be done to help find the cause of chest pains or other symptoms of heart disease.  During this procedure, a large X-ray machine, called a CT scanner, takes detailed pictures of the heart and the surrounding area after a contrast dye has been injected into blood vessels in the area.  Ask your health care provider about changing or stopping your regular medicines before the procedure. This is especially important if you are taking diabetes medicines, blood thinners, or medicines to treat erectile dysfunction.  If you are  told, drink enough fluid to keep your urine pale yellow. This will help to flush the contrast dye out of your body. This information is not intended to replace advice given to you by your health care provider. Make sure you discuss any questions you have with your health care provider. Document Revised: 12/21/2018 Document Reviewed: 12/21/2018 Elsevier Patient Education  [place].

## 2019-11-09 NOTE — Progress Notes (Signed)
Cardiology Office Note:    Date:  11/09/2019   ID:  Louis Carrillo, DOB 06/21/1945, MRN [id number]  PCP:  Elenore Paddy, NP  Cardiologist:  Jenean Lindau, MD   Referring MD: Elenore Paddy, NP    ASSESSMENT:    1. Essential hypertension   2. Abnormal nuclear stress test   3. Mixed dyslipidemia   4. Chest tightness   5. Ex-smoker    PLAN:    In order of problems listed above:  1. Abnormal nuclear stress test: I discussed my findings with the patient at extensive length.  He is having good effort tolerance and is exercising regularly.  I discussed findings of the test and recommended CT coronary angiography with FFR and he agrees.  Invasive evaluation was also discussed but is not keen on it.  I respect his wishes.  He will continue sublingual nitroglycerin use as needed if needed and take aspirin on a daily basis.  His blood pressure is borderline therefore I do not want to add any medication such as beta-blocker. 2. Mixed dyslipidemia: His lipids are markedly elevated.  He has not tolerated other statins in the past.  I will get a copy of his liver function tests.  I will start him on Livalo 1 mg daily and advised him to take co-Q10 with him on a regular basis.  I would like to see if he tolerates it well.  Statin therapy will be very helpful in this gentleman with multiple risk factors for coronary artery disease and elevated elevated LDL.  Benefits and potential risks explained and he vocalized understanding. 3. Essential hypertension: Blood pressure stable 4. Ex-smoker: Promises never to go back to smoking.  Further recommendations will be made based on the findings of the coronary angiography.Patient will be seen in follow-up appointment in 2 months or earlier if the patient has any concerns    Medication Adjustments/Labs and Tests Ordered: Current medicines are reviewed at length with the patient today.  Concerns regarding medicines are outlined above.  No orders of the  defined types were placed in this encounter.  No orders of the defined types were placed in this encounter.    Chief Complaint  Patient presents with  . Follow-up     History of Present Illness:    Louis Carrillo is a 74 y.o. male.  Patient has past medical history of essential hypertension and dyslipidemia and quit smoking 3 months ago.  He had some history of chest tightness and stress test has been abnormal.  But the patient is walking half an hour every day on a daily basis without any significant problems.  No chest pain orthopnea or PND now.  At the time of my evaluation, the patient is alert awake oriented and in no distress.  Past Medical History:  Diagnosis Date  . Cancer ([place])   . Chest tightness 10/10/2019  . Essential hypertension 10/10/2019  . Ex-smoker 10/10/2019    Past Surgical History:  Procedure Laterality Date  . NO PAST SURGERIES      Current Medications: Current Meds  Medication Sig  . aspirin EC 81 MG tablet Take 1 tablet (81 mg total) by mouth daily. Swallow whole.  . Garlic (GARLIQUE PO) Take by mouth daily.  Marland Kitchen losartan-hydrochlorothiazide (HYZAAR) 50-12.5 MG tablet Take 1 tablet by mouth daily.  . nitroGLYCERIN (NITROSTAT) 0.4 MG SL tablet Place 1 tablet (0.4 mg total) under the tongue every 5 (five) minutes as needed.  . TURMERIC PO Take 1,500 mg  by mouth daily.     Allergies:   Patient has no known allergies.   Social History   Socioeconomic History  . Marital status: Married    Spouse name: Not on file  . Number of children: Not on file  . Years of education: Not on file  . Highest education level: Not on file  Occupational History  . Not on file  Tobacco Use  . Smoking status: Former Smoker    Types: Cigarettes    Quit date: 07/1998    Years since quitting: 21.3  . Smokeless tobacco: Never Used  Substance and Sexual Activity  . Alcohol use: Yes    Alcohol/week: 12.0 standard drinks    Types: 12 Cans of beer per week  . Drug use: No   . Sexual activity: Not on file  Other Topics Concern  . Not on file  Social History Narrative  . Not on file   Social Determinants of Health   Financial Resource Strain:   . Difficulty of Paying Living Expenses:   Food Insecurity:   . Worried About Charity fundraiser in the Last Year:   . Arboriculturist in the Last Year:   Transportation Needs:   . Film/video editor (Medical):   Marland Kitchen Lack of Transportation (Non-Medical):   Physical Activity:   . Days of Exercise per Week:   . Minutes of Exercise per Session:   Stress:   . Feeling of Stress :   Social Connections:   . Frequency of Communication with Friends and Family:   . Frequency of Social Gatherings with Friends and Family:   . Attends Religious Services:   . Active Member of Clubs or Organizations:   . Attends Archivist Meetings:   Marland Kitchen Marital Status:      Family History: The patient's family history includes Alcohol abuse in his paternal grandfather; Heart attack in his maternal grandmother; Hypertension in his brother, father, and mother.  ROS:   Please see the history of present illness.    All other systems reviewed and are negative.  EKGs/Labs/Other Studies Reviewed:    The following studies were reviewed today: IMPRESSIONS    1. Left ventricular ejection fraction, by estimation, is 60 to 65%. The  left ventricle has normal function. The left ventricle has no regional  wall motion abnormalities. Left ventricular diastolic parameters are  consistent with Grade I diastolic  dysfunction (impaired relaxation).  2. Left atrial size was mildly dilated.  3. The aortic valve is normal in structure. Aortic valve regurgitation is  not visualized. No aortic stenosis is present.  4. 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   Study Highlights   The left ventricular ejection fraction is hyperdynamic (>65%).  Nuclear stress EF: 69%.  There was no ST segment deviation  noted during stress.  No T wave inversion was noted during stress.  Defect 1: There is a small defect of mild severity present in the basal inferior location.  Findings consistent with ischemia.  This is a low risk study.    Recent Labs: 01/23/2019: ALT 19; BUN 14; Creatinine, Ser 0.91; Hemoglobin 14.8; Platelets 171; Potassium 4.4; Sodium 138  Recent Lipid Panel No results found for: CHOL, TRIG, HDL, CHOLHDL, VLDL, LDLCALC, LDLDIRECT  Physical Exam:    VS:  BP 120/64 (BP Location: Left Arm, Patient Position: Sitting, Cuff Size: Normal)   Pulse 72   Ht 5\' 10"  (1.778 m)   Wt 196  lb (88.9 kg)   SpO2 96%   BMI 28.12 kg/m     Wt Readings from Last 3 Encounters:  11/09/19 196 lb (88.9 kg)  10/19/19 197 lb (89.4 kg)  10/10/19 197 lb (89.4 kg)     GEN: Patient is in no acute distress HEENT: Normal NECK: No JVD; No carotid bruits LYMPHATICS: No lymphadenopathy CARDIAC: Hear sounds regular, 2/6 systolic murmur at the apex. RESPIRATORY:  Clear to auscultation without rales, wheezing or rhonchi  ABDOMEN: Soft, non-tender, non-distended MUSCULOSKELETAL:  No edema; No deformity  SKIN: Warm and dry NEUROLOGIC:  Alert and oriented x 3 PSYCHIATRIC:  Normal affect   Signed, Jenean Lindau, MD  11/09/2019 9:52 AM    [place]

## 2019-11-28 DIAGNOSIS — R0789 Other chest pain: Secondary | ICD-10-CM | POA: Diagnosis not present

## 2019-11-28 DIAGNOSIS — E782 Mixed hyperlipidemia: Secondary | ICD-10-CM | POA: Diagnosis not present

## 2019-11-28 DIAGNOSIS — I10 Essential (primary) hypertension: Secondary | ICD-10-CM | POA: Diagnosis not present

## 2019-11-28 DIAGNOSIS — R9439 Abnormal result of other cardiovascular function study: Secondary | ICD-10-CM | POA: Diagnosis not present

## 2019-11-28 LAB — BASIC METABOLIC PANEL
BUN/Creatinine Ratio: 17 (ref 10–24)
BUN: 15 mg/dL (ref 8–27)
CO2: 27 mmol/L (ref 20–29)
Calcium: 9.6 mg/dL (ref 8.6–10.2)
Chloride: 100 mmol/L (ref 96–106)
Creatinine, Ser: 0.88 mg/dL (ref 0.76–1.27)
GFR calc Af Amer: 99 mL/min/{1.73_m2} (ref >59)
GFR calc non Af Amer: 85 mL/min/{1.73_m2} (ref >59)
Glucose: 92 mg/dL (ref 65–99)
Potassium: 4.7 mmol/L (ref 3.5–5.2)
Sodium: 137 mmol/L (ref 134–144)

## 2019-12-04 ENCOUNTER — Telehealth (HOSPITAL_COMMUNITY): Payer: Self-pay | Admitting: *Deleted

## 2019-12-04 NOTE — Telephone Encounter (Signed)

## 2019-12-05 ENCOUNTER — Encounter (HOSPITAL_COMMUNITY): Payer: Self-pay

## 2019-12-05 ENCOUNTER — Ambulatory Visit (HOSPITAL_COMMUNITY)
Admission: RE | Admit: 2019-12-05 | Discharge: 2019-12-05 | Disposition: A | Discharge status: Home / Self Care | Payer: PPO | Source: Ambulatory Visit | Attending: Cardiology | Admitting: Cardiology

## 2019-12-05 DIAGNOSIS — R9439 Abnormal result of other cardiovascular function study: Secondary | ICD-10-CM | POA: Diagnosis not present

## 2019-12-05 DIAGNOSIS — R0789 Other chest pain: Secondary | ICD-10-CM | POA: Insufficient documentation

## 2019-12-05 DIAGNOSIS — R072 Precordial pain: Secondary | ICD-10-CM

## 2019-12-05 IMAGING — CT CT HEART MORP W/ CTA COR W/ SCORE W/ CA W/CM &/OR W/O CM
4 of 7 series · 8 of 20 positions shown, 9 images · IV contrast (APPLIED)
Comparison: None.
COMPARISON: None.

Addendum:
EXAM:
OVER-READ INTERPRETATION  CT CHEST

The following report is an over-read performed by radiologist Dr.
FOYEZ [REDACTED] on [DATE]. This
over-read does not include interpretation of cardiac or coronary
anatomy or pathology. The coronary calcium score/coronary CTA
interpretation by the cardiologist is attached.
TECHNIQUE: The patient was scanned on a Phillips Force scanner.

[Series 6: best diast 74 % · axial · 0.39mm/px · z∈[-124,-83]mm · 2 of 308 slices shown]
[im 103/308  vessel]
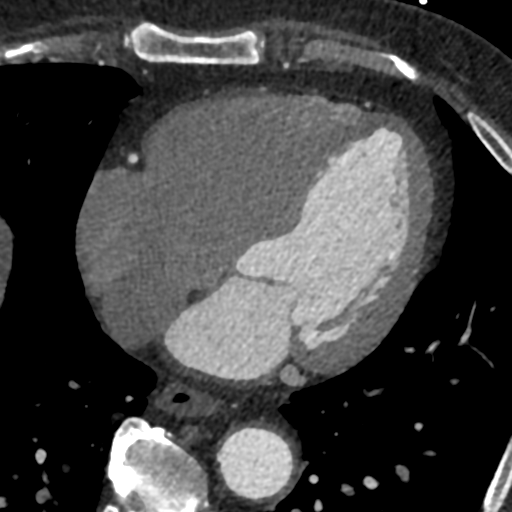
[im 205/308  vessel]
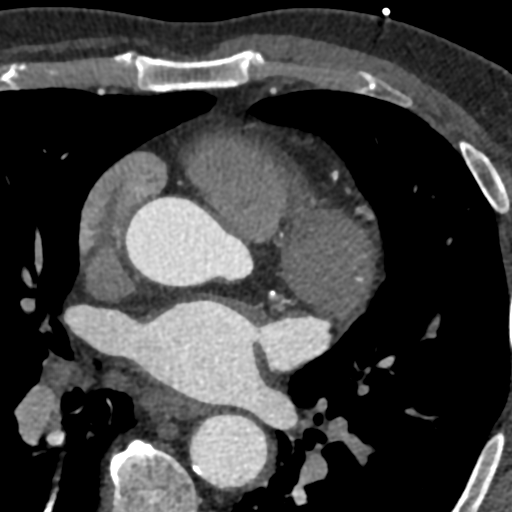

[Series 7: best syst · axial · 0.39mm/px · z∈[-124,-83]mm · 2 of 308 slices shown, 3 images]
[im 103/308  vessel]
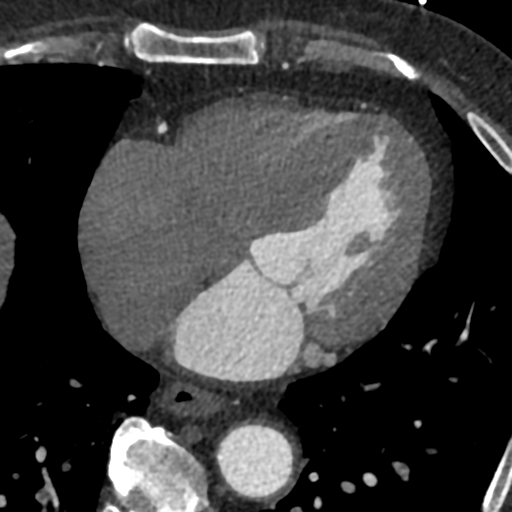
[im 103/308  lung]
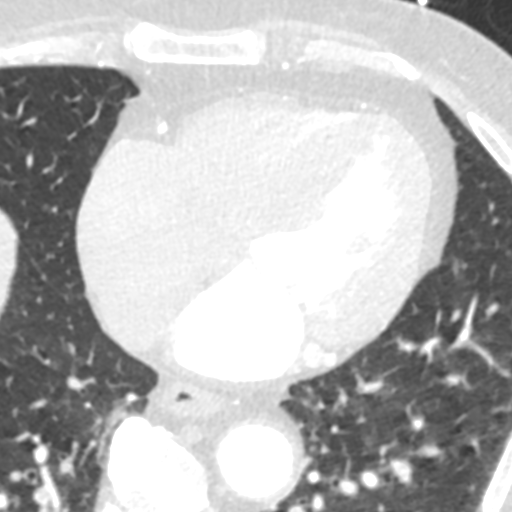
[im 205/308  vessel]
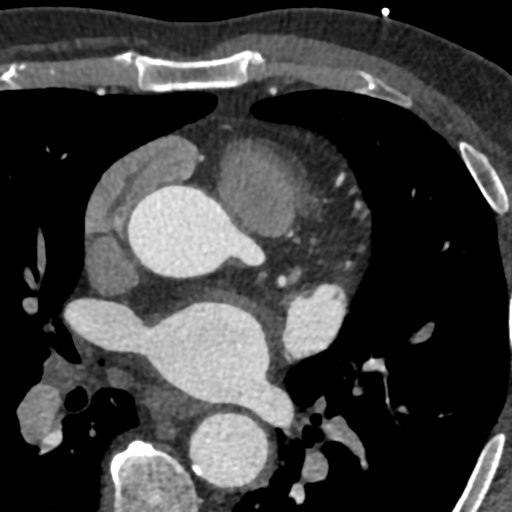

[Series 9: ts diast sharp 74 % · axial · 0.39mm/px · z∈[-124,-83]mm · 2 of 308 slices shown]
[im 103/308  lung]
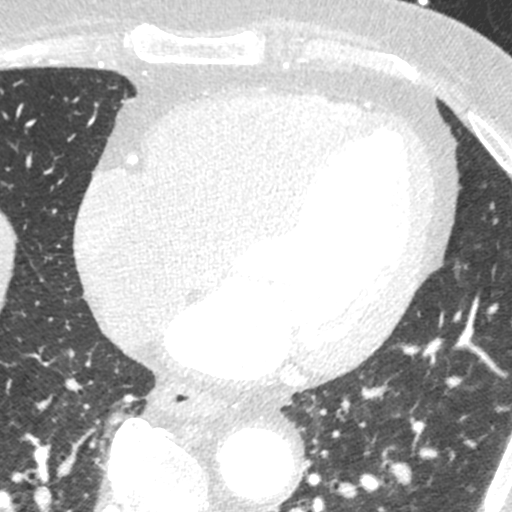
[im 205/308  lung]
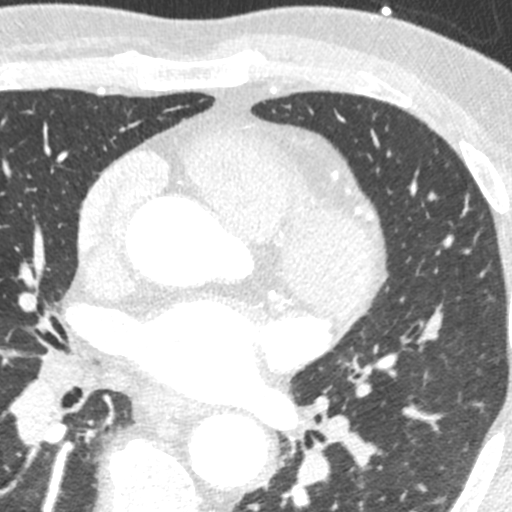

[Series 10: ts syst sharp · axial · 0.39mm/px · z∈[-124,-83]mm · 2 of 308 slices shown]
[im 103/308  lung]
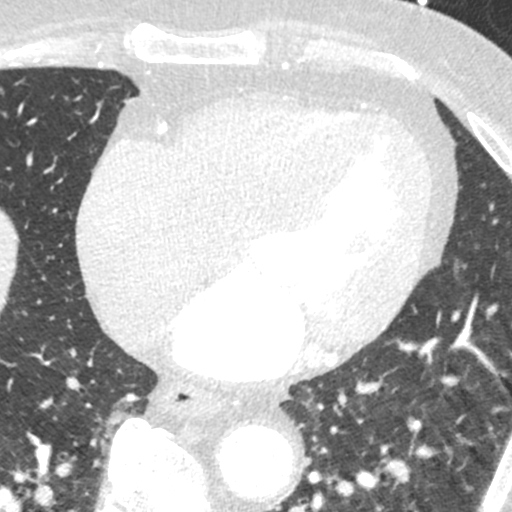
[im 205/308  lung]
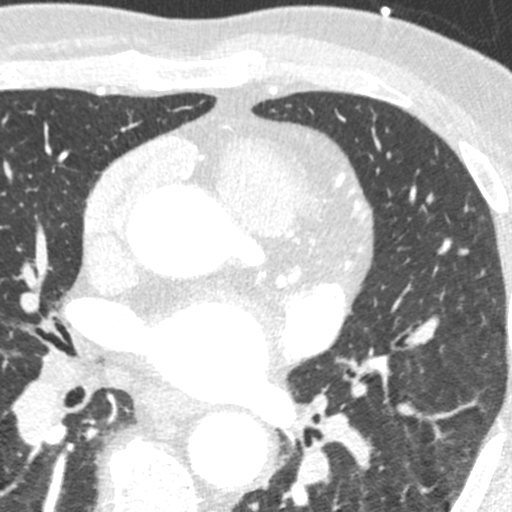

[8 of 20 positions shown; findings below may reference images not displayed]

FINDINGS: Aortic atherosclerosis. Within the visualized portions of the thorax
there are no suspicious appearing pulmonary nodules or masses, there
is no acute consolidative airspace disease, no pleural effusions, no
pneumothorax and no lymphadenopathy. Visualized portions of the
upper abdomen are unremarkable. There are no aggressive appearing
lytic or blastic lesions noted in the visualized portions of the
skeleton.
IMPRESSION: 1.  Aortic Atherosclerosis ([DM]-[DM]).

EXAM:
Cardiac/Coronary  CT
FINDINGS: A 120 kV prospective scan was triggered in the descending thoracic
aorta at 111 HU's. Axial non-contrast 3 mm slices were carried out
through the heart. The data set was analyzed on a dedicated work
station and scored using the Agatson method. Gantry rotation speed
was 250 msecs and collimation was .6 mm. No beta blockade and 0.8 mg
of sl NTG was given. The 3D data set was reconstructed in 5%
intervals of the 67-82 % of the R-R cycle. Diastolic phases were
analyzed on a dedicated work station using MPR, MIP and VRT modes.
The patient received 80 cc of contrast.

Aorta: Normal size. Scattered calcifications in the ascending and
descending thoracic aorta. No dissection.

Aortic Valve:  Trileaflet.  No calcifications.

Coronary Arteries:  Normal coronary origin.  Right dominance.

RCA is a large dominant artery that gives rise to PDA and PLVB.
There is moderate calcified plaque in the proximal RCA with
associated stenosis of 50-69%. There is minimal calcified plaque in
the distal RCA with associated stenosis of 0-24%.

Left main is a large artery that gives rise to LAD, Ramus and LCX
arteries. There is minimal calcified plaque in the proximal LM with
associated stenosis of 0-24%.

LAD is a large vessel that gives rise to a large branching diagonal.
There is mild calcified plaque in the proximal LAD with associated
stenosis of 25-49%. There is moderate mixed plaque in the mid LAD
with associated stenosis of 50-69%. There is scattered calcified
plaque in the distal LAD which is a very small caliber vessel. There
is a moderate mixed plaque in the mid D1 with associated stenosis of
50-69%

Ramus is a moderate sized vessel. There is moderate calcified plaque
in the ostial Ramus with associated stenosis of 50-69%.

LCX is a non-dominant artery that gives rise to one large OM1
branch. There is mild calcified plaque in the proximal LCx with
associated stenosis of 25-49%.

Other findings:

Normal pulmonary vein drainage into the left atrium.

Normal let atrial appendage without a thrombus.

Normal size of the pulmonary artery.
IMPRESSION: 1. Coronary calcium score of 188. This was 48th percentile for age
and sex matched control.

2.  Normal coronary origin with right dominance.

3.  Moderate atherosclerosis of the LAD and RCA.  CAD RADS 3.

4. Consider symptom-guided anti-ischemic and preventive
pharmacotherapy as well as risk factor modification per
guideline-directed care.

5.  This study has been submitted for FFR analysis.

FOYEZ

*** End of Addendum ***
EXAM:
OVER-READ INTERPRETATION  CT CHEST

The following report is an over-read performed by radiologist Dr.
FOYEZ [REDACTED] on [DATE]. This
over-read does not include interpretation of cardiac or coronary
anatomy or pathology. The coronary calcium score/coronary CTA
interpretation by the cardiologist is attached.
FINDINGS: Aortic atherosclerosis. Within the visualized portions of the thorax
there are no suspicious appearing pulmonary nodules or masses, there
is no acute consolidative airspace disease, no pleural effusions, no
pneumothorax and no lymphadenopathy. Visualized portions of the
upper abdomen are unremarkable. There are no aggressive appearing
lytic or blastic lesions noted in the visualized portions of the
skeleton.
IMPRESSION: 1.  Aortic Atherosclerosis ([DM]-[DM]).

## 2019-12-05 MED ORDER — IOHEXOL 350 MG/ML SOLN
80.0000 mL | Freq: Once | INTRAVENOUS | Status: AC | PRN
  Administered 2019-12-05: 80 mL via INTRAVENOUS

## 2019-12-05 MED ORDER — NITROGLYCERIN 0.4 MG SL SUBL
SUBLINGUAL_TABLET | SUBLINGUAL | Status: AC
  Filled 2019-12-05: qty 2

## 2019-12-05 MED ORDER — NITROGLYCERIN 0.4 MG SL SUBL: 0.8000 mg | SUBLINGUAL_TABLET | Freq: Once | SUBLINGUAL | Status: DC

## 2019-12-06 ENCOUNTER — Ambulatory Visit (HOSPITAL_COMMUNITY)
Admission: RE | Admit: 2019-12-06 | Discharge: 2019-12-06 | Disposition: A | Discharge status: Home / Self Care | Payer: PPO | Source: Ambulatory Visit | Attending: Cardiology | Admitting: Cardiology

## 2019-12-06 DIAGNOSIS — R0789 Other chest pain: Secondary | ICD-10-CM | POA: Insufficient documentation

## 2019-12-06 DIAGNOSIS — R072 Precordial pain: Secondary | ICD-10-CM | POA: Diagnosis not present

## 2019-12-06 DIAGNOSIS — R9439 Abnormal result of other cardiovascular function study: Secondary | ICD-10-CM | POA: Diagnosis not present

## 2019-12-10 DIAGNOSIS — R072 Precordial pain: Secondary | ICD-10-CM | POA: Diagnosis not present

## 2019-12-19 ENCOUNTER — Encounter: Payer: Self-pay | Admitting: Cardiology

## 2019-12-19 ENCOUNTER — Other Ambulatory Visit: Payer: Self-pay

## 2019-12-19 ENCOUNTER — Ambulatory Visit: Payer: PPO | Admitting: Cardiology

## 2019-12-19 VITALS — BP 132/68 | HR 70 | Ht 70.0 in | Wt 196.6 lb

## 2019-12-19 DIAGNOSIS — I10 Essential (primary) hypertension: Secondary | ICD-10-CM | POA: Diagnosis not present

## 2019-12-19 DIAGNOSIS — R9439 Abnormal result of other cardiovascular function study: Secondary | ICD-10-CM | POA: Diagnosis not present

## 2019-12-19 DIAGNOSIS — E782 Mixed hyperlipidemia: Secondary | ICD-10-CM | POA: Diagnosis not present

## 2019-12-19 DIAGNOSIS — I2510 Atherosclerotic heart disease of native coronary artery without angina pectoris: Secondary | ICD-10-CM

## 2019-12-19 DIAGNOSIS — Z87891 Personal history of nicotine dependence: Secondary | ICD-10-CM

## 2019-12-19 HISTORY — DX: Atherosclerotic heart disease of native coronary artery without angina pectoris: I25.10

## 2019-12-19 MED ORDER — LIVALO 1 MG PO TABS: 1.0000 mg | ORAL_TABLET | Freq: Every day | ORAL | 3 refills | Status: DC

## 2019-12-19 MED ORDER — RANOLAZINE ER 500 MG PO TB12: 500.0000 mg | ORAL_TABLET | Freq: Two times a day (BID) | ORAL | 0 refills | Status: DC

## 2019-12-19 MED ORDER — RANOLAZINE ER 1000 MG PO TB12: 1000.0000 mg | ORAL_TABLET | Freq: Two times a day (BID) | ORAL | 3 refills | Status: DC

## 2019-12-19 NOTE — Progress Notes (Signed)
Cardiology Office Note:    Date:  12/19/2019   ID:  Louis Carrillo, DOB [birth year], MRN [id number]  PCP:  Louis Paddy, NP  Cardiologist:  Louis Lindau, MD   Referring MD: Louis Paddy, NP    ASSESSMENT:    1. Essential hypertension   2. Coronary artery disease involving native coronary artery of native heart without angina pectoris   3. Abnormal nuclear stress test   4. Ex-smoker   5. Mixed dyslipidemia    PLAN:    In order of problems listed above:  1. Coronary artery disease: Secondary prevention stressed with the patient.  Importance of compliance with diet medical patient stressed and vocalized understanding.  He is walking at least 2 miles a day without any symptoms.  He is happy about it.  Results of the CT coronary angiography discussed with him at length. 2. Essential hypertension: Blood pressure stable 3. Mixed dyslipidemia: Patient is tolerating Livalo well.  He will be back in 1 month for liver lipid check.  Lipids were detailed to him and diet emphasized 4. Stable angina: I started Ranexa 500 mg twice daily for 2 weeks then 1 g twice daily after that. 5. Patient will be seen in follow-up appointment in 3 months or earlier if the patient has any concerns.  He was told never to go back to smoking.  He promises.    Medication Adjustments/Labs and Tests Ordered: Current medicines are reviewed at length with the patient today.  Concerns regarding medicines are outlined above.  No orders of the defined types were placed in this encounter.  No orders of the defined types were placed in this encounter.    No chief complaint on file.    History of Present Illness:    Louis Carrillo is a 74 y.o. male.  Patient has past medical history of coronary artery disease, essential hypertension dyslipidemia.  He is an ex-smoker.  He had significant symptoms and underwent stress testing which was abnormal.  Subsequently CT coronary angiography was done which revealed  distal disease for which medical therapy was recommended.  Patient subsequently has been walking regularly.  No chest pain orthopnea or PND.  At the time of my evaluation, the patient is alert awake oriented and in no distress.  He is tolerating Livalo well.  He has marked dyslipidemia.  Past Medical History:  Diagnosis Date  . Cancer ([place])   . Chest tightness 10/10/2019  . Essential hypertension 10/10/2019  . Ex-smoker 10/10/2019    Past Surgical History:  Procedure Laterality Date  . NO PAST SURGERIES      Current Medications: Current Meds  Medication Sig  . aspirin EC 81 MG tablet Take 1 tablet (81 mg total) by mouth daily. Swallow whole.  . ezetimibe (ZETIA) 10 MG tablet Take 10 mg by mouth daily.   . Garlic (GARLIQUE PO) Take by mouth daily.  Marland Kitchen losartan-hydrochlorothiazide (HYZAAR) 50-12.5 MG tablet Take 1 tablet by mouth daily.  . nitroGLYCERIN (NITROSTAT) 0.4 MG SL tablet Place 1 tablet (0.4 mg total) under the tongue every 5 (five) minutes as needed.  . TURMERIC PO Take 1,500 mg by mouth daily.     Allergies:   Patient has no known allergies.   Social History   Socioeconomic History  . Marital status: Married    Spouse name: Not on file  . Number of children: Not on file  . Years of education: Not on file  . Highest education level: Not on file  Occupational  History  . Not on file  Tobacco Use  . Smoking status: Former Smoker    Types: Cigarettes    Quit date: 07/1998    Years since quitting: 21.4  . Smokeless tobacco: Never Used  Substance and Sexual Activity  . Alcohol use: Yes    Alcohol/week: 12.0 standard drinks    Types: 12 Cans of beer per week  . Drug use: No  . Sexual activity: Not on file  Other Topics Concern  . Not on file  Social History Narrative  . Not on file   Social Determinants of Health   Financial Resource Strain:   . Difficulty of Paying Living Expenses:   Food Insecurity:   . Worried About Charity fundraiser in the Last Year:   .  Arboriculturist in the Last Year:   Transportation Needs:   . Film/video editor (Medical):   Marland Kitchen Lack of Transportation (Non-Medical):   Physical Activity:   . Days of Exercise per Week:   . Minutes of Exercise per Session:   Stress:   . Feeling of Stress :   Social Connections:   . Frequency of Communication with Friends and Family:   . Frequency of Social Gatherings with Friends and Family:   . Attends Religious Services:   . Active Member of Clubs or Organizations:   . Attends Archivist Meetings:   Marland Kitchen Marital Status:      Family History: The patient's family history includes Alcohol abuse in his paternal grandfather; Heart attack in his maternal grandmother; Hypertension in his brother, father, and mother.  ROS:   Please see the history of present illness.    All other systems reviewed and are negative.  EKGs/Labs/Other Studies Reviewed:    The following studies were reviewed today: EXAM: FFRCT ANALYSIS  FINDINGS: FFRct analysis was performed on the original cardiac CT angiogram dataset. Diagrammatic representation of the FFRct analysis is provided in a separate PDF document in PACS. This dictation was created using the PDF document and an interactive 3D model of the results. 3D model is not available in the EMR/PACS. Normal FFR range is >0.80.  1. Left Main: No significant stenosis.  LM FFR=0.99.  2. LAD: Possible flow limiting lesion in distal LAD and Diagonal. Proximal FFR = 0.99, Mid FFR = 0.93, Distal FFR = 0.72.  3. LCX: No significant stenosis. Proximal FFR=0.99, Mid FFR = 0.90, Distal FFR = 0.87.  4. Ramus: No significant stenosis. Proximal FFR=0.98. Mid and distal FFR not calculated.  5. RCA: Possible flow limiting lesion in distal RCA. Proximal FFR = 0.99, Mid FFR = 0.94, Distal FFR = 0.63  IMPRESSION: 1. CT FFR flow analysis demonstrates possible flow limiting lesions in distal RCA, LAD and Diagonal. There is gradual reduction  in flow in the mid to distal LAD possibly representing small vessel disease vs. Tapering of vessel. Possible flow limiting lesion in mid diagonal #1. The distal RCA and PDA are small vessels and reduced flow in these territories is likely related to small vessel disease.  2.  Recommend medical management.  Fransico Him   Electronically Signed   By: Fransico Him   On: 12/10/2019 19:20   Recent Labs: 01/23/2019: ALT 19; Hemoglobin 14.8; Platelets 171 11/28/2019: BUN 15; Creatinine, Ser 0.88; Potassium 4.7; Sodium 137  Recent Lipid Panel No results found for: CHOL, TRIG, HDL, CHOLHDL, VLDL, LDLCALC, LDLDIRECT  Physical Exam:    VS:  BP 132/68   Pulse 70  Ht 5\' 10"  (1.778 m)   Wt 196 lb 9.6 oz (89.2 kg)   SpO2 95%   BMI 28.21 kg/m     Wt Readings from Last 3 Encounters:  12/19/19 196 lb 9.6 oz (89.2 kg)  11/09/19 196 lb (88.9 kg)  10/19/19 197 lb (89.4 kg)     GEN: Patient is in no acute distress HEENT: Normal NECK: No JVD; No carotid bruits LYMPHATICS: No lymphadenopathy CARDIAC: Hear sounds regular, 2/6 systolic murmur at the apex. RESPIRATORY:  Clear to auscultation without rales, wheezing or rhonchi  ABDOMEN: Soft, non-tender, non-distended MUSCULOSKELETAL:  No edema; No deformity  SKIN: Warm and dry NEUROLOGIC:  Alert and oriented x 3 PSYCHIATRIC:  Normal affect   Signed, Louis Lindau, MD  12/19/2019 10:22 AM    [facility]

## 2019-12-19 NOTE — Patient Instructions (Addendum)
Medication Instructions:  Your physician has recommended you make the following change in your medication:   Start Ranexa 500 mg twice daily for 2 weeks. After 2 weeks increase Ranexa to 1000 mg twice daily.   *If you need a refill on your cardiac medications before your next appointment, please call your pharmacy*   Lab Work: Your physician recommends that you return for lab work in: 1 month (01/19/20). You need to have labs done when you are fasting.  You can come Monday through Friday 8:30 am to 12:00 pm and 1:15 to 4:30. You do not need to make an appointment as the order has already been placed. The labs you are going to have done are BMET, LFT and Lipids.   If you have labs (blood work) drawn today and your tests are completely normal, you will receive your results only by: Marland Kitchen MyChart Message (if you have MyChart) OR . A paper copy in the mail If you have any lab test that is abnormal or we need to change your treatment, we will call you to review the results.   Testing/Procedures: None ordered   Follow-Up: At [facility], you and your health needs are our priority.  As part of our continuing mission to provide you with exceptional heart care, we have created designated Provider Care Teams.  These Care Teams include your primary Cardiologist (physician) and Advanced Practice Providers (APPs -  Physician Assistants and Nurse Practitioners) who all work together to provide you with the care you need, when you need it.  We recommend signing up for the patient portal called "MyChart".  Sign up information is provided on this After Visit Summary.  MyChart is used to connect with patients for Virtual Visits (Telemedicine).  Patients are able to view lab/test results, encounter notes, upcoming appointments, etc.  Non-urgent messages can be sent to your provider as well.   To learn more about what you can do with MyChart, go to [email or web address].    Your next appointment:   3  month(s)  The format for your next appointment:   In Person  Provider:   Jyl Heinz, MD   Other Instructions Ranolazine tablets, extended release What is this medicine? RANOLAZINE (ra NOE la zeen) is a heart medicine. It is used to treat chronic chest pain (angina). This medicine must be taken regularly. It will not relieve an acute episode of chest pain. This medicine may be used for other purposes; ask your health care provider or pharmacist if you have questions. COMMON BRAND NAME(S): Ranexa What should I tell my health care provider before I take this medicine? They need to know if you have any of these conditions:  heart disease  irregular heartbeat  kidney disease  liver disease  low levels of potassium or magnesium in the blood  an unusual or allergic reaction to ranolazine, other medicines, foods, dyes, or preservatives  pregnant or trying to get pregnant  breast-feeding How should I use this medicine? Take this medicine by mouth with a glass of water. Follow the directions on the prescription label. Do not cut, crush, or chew this medicine. Take with or without food. Do not take this medication with grapefruit juice. Take your doses at regular intervals. Do not take your medicine more often then directed. Talk to your pediatrician regarding the use of this medicine in children. Special care may be needed. Overdosage: If you think you have taken too much of this medicine contact a poison control center or emergency  room at once. NOTE: This medicine is only for you. Do not share this medicine with others. What if I miss a dose? If you miss a dose, take it as soon as you can. If it is almost time for your next dose, take only that dose. Do not take double or extra doses. What may interact with this medicine? Do not take this medicine with any of the following medications:  antivirals for HIV or AIDS  cerivastatin  certain antibiotics like chloramphenicol,  clarithromycin, dalfopristin; quinupristin, isoniazid, rifabutin, rifampin, rifapentine  certain medicines used for cancer like imatinib, nilotinib  certain medicines for fungal infections like fluconazole, itraconazole, ketoconazole, posaconazole, voriconazole  certain medicines for irregular heart beat like dronedarone  certain medicines for seizures like carbamazepine, fosphenytoin, oxcarbazepine, phenobarbital, phenytoin  cisapride  conivaptan  cyclosporine  grapefruit or grapefruit juice  lumacaftor; ivacaftor  nefazodone  pimozide  quinacrine  St John's wort  thioridazine This medicine may also interact with the following medications:  alfuzosin  certain medicines for depression, anxiety, or psychotic disturbances like bupropion, citalopram, fluoxetine, fluphenazine, paroxetine, perphenazine, risperidone, sertraline, trifluoperazine  certain medicines for cholesterol like atorvastatin, lovastatin, simvastatin  certain medicines for stomach problems like octreotide, palonosetron, prochlorperazine  eplerenone  ergot alkaloids like dihydroergotamine, ergonovine, ergotamine, methylergonovine  metformin  nicardipine  other medicines that prolong the QT interval (cause an abnormal heart rhythm) like dofetilide, ziprasidone  sirolimus  tacrolimus This list may not describe all possible interactions. Give your health care provider a list of all the medicines, herbs, non-prescription drugs, or dietary supplements you use. Also tell them if you smoke, drink alcohol, or use illegal drugs. Some items may interact with your medicine. What should I watch for while using this medicine? Visit your doctor for regular check ups. Tell your doctor or healthcare professional if your symptoms do not start to get better or if they get worse. This medicine will not relieve an acute attack of angina or chest pain. This medicine can change your heart rhythm. Your health care  provider may check your heart rhythm by ordering an electrocardiogram (ECG) while you are taking this medicine. You may get drowsy or dizzy. Do not drive, use machinery, or do anything that needs mental alertness until you know how this medicine affects you. Do not stand or sit up quickly, especially if you are an older patient. This reduces the risk of dizzy or fainting spells. Alcohol may interfere with the effect of this medicine. Avoid alcoholic drinks. If you are scheduled for any medical or dental procedure, tell your healthcare provider that you are taking this medicine. This medicine can interact with other medicines used during surgery. What side effects may I notice from receiving this medicine? Side effects that you should report to your doctor or health care professional as soon as possible:  allergic reactions like skin rash, itching or hives, swelling of the face, lips, or tongue  breathing problems  changes in vision  fast, irregular or pounding heartbeat  feeling faint or lightheaded, falls  low or high blood pressure  numbness or tingling feelings  ringing in the ears  tremor or shakiness  slow heartbeat (fewer than 50 beats per minute)  swelling of the legs or feet Side effects that usually do not require medical attention (report to your doctor or health care professional if they continue or are bothersome):  constipation  drowsy  dry mouth  headache  nausea or vomiting  stomach upset This list may not describe all  possible side effects. Call your doctor for medical advice about side effects. You may report side effects to FDA at 1-800-FDA-1088. Where should I keep my medicine? Keep out of the reach of children. Store at room temperature between 15 and 30 degrees C (59 and 86 degrees F). Throw away any unused medicine after the expiration date. NOTE: This sheet is a summary. It may not cover all possible information. If you have questions about this  medicine, talk to your doctor, pharmacist, or health care provider.  2020 Elsevier/Gold Standard (2018-04-19 09:18:49)

## 2020-01-12 ENCOUNTER — Encounter: Payer: Self-pay | Admitting: Gastroenterology

## 2020-01-16 ENCOUNTER — Ambulatory Visit: Payer: PPO | Admitting: Cardiology

## 2020-01-30 ENCOUNTER — Ambulatory Visit: Payer: PPO | Attending: Internal Medicine

## 2020-01-30 DIAGNOSIS — Z23 Encounter for immunization: Secondary | ICD-10-CM

## 2020-01-30 NOTE — Progress Notes (Signed)
   Covid-19 Vaccination Clinic  Name:  Brigham Cobbins    MRN: [id number] DOB: [birth year]  01/30/2020  Mr. Riebel was observed post Covid-19 immunization for 15 minutes without incident. He was provided with Vaccine Information Sheet and instruction to access the V-Safe system.   Mr. Nesbit was instructed to call 911 with any severe reactions post vaccine: Marland Kitchen Difficulty breathing  . Swelling of face and throat  . A fast heartbeat  . A bad rash all over body  . Dizziness and weakness

## 2020-02-05 DIAGNOSIS — Z20828 Contact with and (suspected) exposure to other viral communicable diseases: Secondary | ICD-10-CM | POA: Diagnosis not present

## 2020-02-05 DIAGNOSIS — J069 Acute upper respiratory infection, unspecified: Secondary | ICD-10-CM | POA: Diagnosis not present

## 2020-02-05 DIAGNOSIS — R05 Cough: Secondary | ICD-10-CM | POA: Diagnosis not present

## 2020-02-15 DIAGNOSIS — I10 Essential (primary) hypertension: Secondary | ICD-10-CM | POA: Diagnosis not present

## 2020-02-15 DIAGNOSIS — I2510 Atherosclerotic heart disease of native coronary artery without angina pectoris: Secondary | ICD-10-CM | POA: Diagnosis not present

## 2020-02-15 DIAGNOSIS — E782 Mixed hyperlipidemia: Secondary | ICD-10-CM | POA: Diagnosis not present

## 2020-02-16 LAB — BASIC METABOLIC PANEL
BUN/Creatinine Ratio: 17 (ref 10–24)
BUN: 20 mg/dL (ref 8–27)
CO2: 25 mmol/L (ref 20–29)
Calcium: 9.2 mg/dL (ref 8.6–10.2)
Chloride: 100 mmol/L (ref 96–106)
Creatinine, Ser: 1.16 mg/dL (ref 0.76–1.27)
GFR calc Af Amer: 72 mL/min/{1.73_m2} (ref >59)
GFR calc non Af Amer: 62 mL/min/{1.73_m2} (ref >59)
Glucose: 93 mg/dL (ref 65–99)
Potassium: 4.5 mmol/L (ref 3.5–5.2)
Sodium: 138 mmol/L (ref 134–144)

## 2020-02-16 LAB — HEPATIC FUNCTION PANEL
ALT: 18 IU/L (ref 0–44)
AST: 13 IU/L (ref 0–40)
Albumin: 4.3 g/dL (ref 3.7–4.7)
Alkaline Phosphatase: 80 IU/L (ref 44–121)
Bilirubin Total: 0.7 mg/dL (ref 0.0–1.2)
Bilirubin, Direct: 0.19 mg/dL (ref 0.00–0.40)
Total Protein: 6.6 g/dL (ref 6.0–8.5)

## 2020-02-16 LAB — LIPID PANEL
Chol/HDL Ratio: 3.4 ratio (ref 0.0–5.0)
Cholesterol, Total: 179 mg/dL (ref 100–199)
HDL: 53 mg/dL (ref >39)
LDL Chol Calc (NIH): 109 mg/dL — ABNORMAL HIGH (ref 0–99)
Triglycerides: 96 mg/dL (ref 0–149)
VLDL Cholesterol Cal: 17 mg/dL (ref 5–40)

## 2020-02-19 MED ORDER — PITAVASTATIN CALCIUM 2 MG PO TABS: 2.0000 mg | ORAL_TABLET | Freq: Every day | ORAL | 3 refills | Status: DC

## 2020-02-19 MED ORDER — LIVALO 1 MG PO TABS: 2.0000 mg | ORAL_TABLET | Freq: Every day | ORAL | 3 refills | Status: DC

## 2020-02-19 NOTE — Addendum Note (Signed)
Addended by: Truddie Hidden on: 02/19/2020 10:28 AM   Modules accepted: Orders

## 2020-02-20 DIAGNOSIS — C801 Malignant (primary) neoplasm, unspecified: Secondary | ICD-10-CM | POA: Insufficient documentation

## 2020-02-22 ENCOUNTER — Other Ambulatory Visit: Payer: Self-pay

## 2020-02-22 ENCOUNTER — Encounter: Payer: Self-pay | Admitting: Cardiology

## 2020-02-22 ENCOUNTER — Ambulatory Visit: Payer: PPO | Admitting: Cardiology

## 2020-02-22 VITALS — BP 122/64 | HR 60 | Ht 70.0 in | Wt 190.8 lb

## 2020-02-22 DIAGNOSIS — I2510 Atherosclerotic heart disease of native coronary artery without angina pectoris: Secondary | ICD-10-CM | POA: Diagnosis not present

## 2020-02-22 DIAGNOSIS — E782 Mixed hyperlipidemia: Secondary | ICD-10-CM

## 2020-02-22 DIAGNOSIS — Z87891 Personal history of nicotine dependence: Secondary | ICD-10-CM | POA: Diagnosis not present

## 2020-02-22 DIAGNOSIS — R9439 Abnormal result of other cardiovascular function study: Secondary | ICD-10-CM

## 2020-02-22 DIAGNOSIS — I10 Essential (primary) hypertension: Secondary | ICD-10-CM

## 2020-02-22 MED ORDER — LIVALO 2 MG PO TABS: 2.0000 mg | ORAL_TABLET | Freq: Every day | ORAL | 3 refills | Status: DC

## 2020-02-22 MED ORDER — RANOLAZINE ER 1000 MG PO TB12: 1000.0000 mg | ORAL_TABLET | Freq: Two times a day (BID) | ORAL | 3 refills | Status: DC

## 2020-02-22 NOTE — Patient Instructions (Signed)
Medication Instructions:  Your physician has recommended you make the following change in your medication:   Take Livalo 2 mg daily.  *If you need a refill on your cardiac medications before your next appointment, please call your pharmacy*   Lab Work: None ordered If you have labs (blood work) drawn today and your tests are completely normal, you will receive your results only by: Marland Kitchen MyChart Message (if you have MyChart) OR . A paper copy in the mail If you have any lab test that is abnormal or we need to change your treatment, we will call you to review the results.   Testing/Procedures: None ordered   Follow-Up: At [facility], you and your health needs are our priority.  As part of our continuing mission to provide you with exceptional heart care, we have created designated Provider Care Teams.  These Care Teams include your primary Cardiologist (physician) and Advanced Practice Providers (APPs -  Physician Assistants and Nurse Practitioners) who all work together to provide you with the care you need, when you need it.  We recommend signing up for the patient portal called "MyChart".  Sign up information is provided on this After Visit Summary.  MyChart is used to connect with patients for Virtual Visits (Telemedicine).  Patients are able to view lab/test results, encounter notes, upcoming appointments, etc.  Non-urgent messages can be sent to your provider as well.   To learn more about what you can do with MyChart, go to [email or web address].    Your next appointment:   3 month(s)  The format for your next appointment:   In Person  Provider:   Jyl Heinz, MD   Other Instructions NA

## 2020-02-22 NOTE — Progress Notes (Signed)
Cardiology Office Note:    Date:  02/22/2020   ID:  Louis Carrillo, DOB 27-Jun-1945, MRN [id number]  PCP:  Elenore Paddy, NP  Cardiologist:  Jenean Lindau, MD   Referring MD: Elenore Paddy, NP    ASSESSMENT:    1. Coronary artery disease involving native coronary artery of native heart without angina pectoris   2. Essential hypertension   3. Abnormal nuclear stress test   4. Ex-smoker   5. Mixed dyslipidemia    PLAN:    In order of problems listed above:  1. Coronary artery disease with stable angina: Secondary prevention stressed with the patient.  Importance of compliance with diet medication stressed any vocalized understanding.  He has an excellent effort tolerance and he will continue exercising. 2. Essential hypertension: Blood pressure is stable and diet was emphasized 3. Mixed dyslipidemia: Diet emphasized.  Lipids reviewed.  He is on statin therapy and will do better with diet with exercise as he promised me.  6 weeks follow-up blood work including Chem-7 liver lipid check 4. Patient will be seen in follow-up appointment in 6 months or earlier if the patient has any concerns He had questions about the usage of Viagra.  I think he is fine to use Viagra and I explained to him the precautions about not using it with nitroglycerin in a 24-hour period and all those details.  Medication Adjustments/Labs and Tests Ordered: Current medicines are reviewed at length with the patient today.  Concerns regarding medicines are outlined above.  No orders of the defined types were placed in this encounter.  No orders of the defined types were placed in this encounter.    No chief complaint on file.    History of Present Illness:    Louis Carrillo is a 74 y.o. male.  Patient has past medical history of coronary artery disease and abnormal nuclear stress test, essential hypertension dyslipidemia.  He denies any problems at this time and takes care of activities of daily  living.  No chest pain orthopnea or PND.  He walks on a regular basis at least half an hour a day without any symptoms.  At the time of my evaluation, the patient is alert awake oriented and in no distress.  Past Medical History:  Diagnosis Date   Abnormal nuclear stress test 11/09/2019   CAD (coronary artery disease) 12/19/2019   Cancer ([place])    Chest tightness 10/10/2019   Essential hypertension 10/10/2019   Ex-smoker 10/10/2019   Mixed dyslipidemia 11/09/2019    Past Surgical History:  Procedure Laterality Date   NO PAST SURGERIES      Current Medications: Current Meds  Medication Sig   aspirin EC 81 MG tablet Take 1 tablet (81 mg total) by mouth daily. Swallow whole.   Garlic (GARLIQUE PO) Take by mouth daily.   losartan-hydrochlorothiazide (HYZAAR) 50-12.5 MG tablet Take 1 tablet by mouth daily.   nitroGLYCERIN (NITROSTAT) 0.4 MG SL tablet Place 1 tablet (0.4 mg total) under the tongue every 5 (five) minutes as needed.   Pitavastatin Calcium (LIVALO) 2 MG TABS Take 2 mg by mouth daily.   ranolazine (RANEXA) 1000 MG SR tablet Take 500 mg by mouth 2 (two) times daily.   TURMERIC PO Take 1,500 mg by mouth daily.     Allergies:   Patient has no known allergies.   Social History   Socioeconomic History   Marital status: Married    Spouse name: Not on file   Number of children:  Not on file   Years of education: Not on file   Highest education level: Not on file  Occupational History   Not on file  Tobacco Use   Smoking status: Former Smoker    Types: Cigarettes    Quit date: 07/1998    Years since quitting: 21.6   Smokeless tobacco: Never Used  Substance and Sexual Activity   Alcohol use: Yes    Alcohol/week: 12.0 standard drinks    Types: 12 Cans of beer per week   Drug use: No   Sexual activity: Not on file  Other Topics Concern   Not on file  Social History Narrative   Not on file   Social Determinants of Health   Financial Resource  Strain:    Difficulty of Paying Living Expenses: Not on file  Food Insecurity:    Worried About [place] in the Last Year: Not on file   Ran Out of Food in the Last Year: Not on file  Transportation Needs:    Lack of Transportation (Medical): Not on file   Lack of Transportation (Non-Medical): Not on file  Physical Activity:    Days of Exercise per Week: Not on file   Minutes of Exercise per Session: Not on file  Stress:    Feeling of Stress : Not on file  Social Connections:    Frequency of Communication with Friends and Family: Not on file   Frequency of Social Gatherings with Friends and Family: Not on file   Attends Religious Services: Not on file   Active Member of Clubs or Organizations: Not on file   Attends Archivist Meetings: Not on file   Marital Status: Not on file     Family History: The patient's family history includes Alcohol abuse in his paternal grandfather; Heart attack in his maternal grandmother; Hypertension in his brother, father, and mother.  ROS:   Please see the history of present illness.    All other systems reviewed and are negative.  EKGs/Labs/Other Studies Reviewed:    The following studies were reviewed today: I discussed my findings with the patient including lipids.   Recent Labs: 02/15/2020: ALT 18; BUN 20; Creatinine, Ser 1.16; Potassium 4.5; Sodium 138  Recent Lipid Panel    Component Value Date/Time   CHOL 179 02/15/2020 0000   TRIG 96 02/15/2020 0000   HDL 53 02/15/2020 0000   CHOLHDL 3.4 02/15/2020 0000   LDLCALC 109 (H) 02/15/2020 0000    Physical Exam:    VS:  BP 122/64    Pulse 60    Ht 5\' 10"  (1.778 m)    Wt 190 lb 12.8 oz (86.5 kg)    SpO2 98%    BMI 27.38 kg/m     Wt Readings from Last 3 Encounters:  02/22/20 190 lb 12.8 oz (86.5 kg)  12/19/19 196 lb 9.6 oz (89.2 kg)  11/09/19 196 lb (88.9 kg)     GEN: Patient is in no acute distress HEENT: Normal NECK: No JVD; No carotid  bruits LYMPHATICS: No lymphadenopathy CARDIAC: Hear sounds regular, 2/6 systolic murmur at the apex. RESPIRATORY:  Clear to auscultation without rales, wheezing or rhonchi  ABDOMEN: Soft, non-tender, non-distended MUSCULOSKELETAL:  No edema; No deformity  SKIN: Warm and dry NEUROLOGIC:  Alert and oriented x 3 PSYCHIATRIC:  Normal affect   Signed, Jenean Lindau, MD  02/22/2020 9:49 AM    [facility]

## 2020-03-06 ENCOUNTER — Other Ambulatory Visit: Payer: Self-pay

## 2020-03-06 ENCOUNTER — Ambulatory Visit (AMBULATORY_SURGERY_CENTER): Payer: Self-pay | Admitting: *Deleted

## 2020-03-06 ENCOUNTER — Encounter: Payer: Self-pay | Admitting: Gastroenterology

## 2020-03-06 VITALS — Ht 70.0 in | Wt 192.0 lb

## 2020-03-06 DIAGNOSIS — Z86010 Personal history of colonic polyps: Secondary | ICD-10-CM

## 2020-03-06 NOTE — Progress Notes (Signed)
Patient is here in-person for PV. Patient denies any allergies to eggs or soy. Patient denies any problems with anesthesia/sedation. Patient denies any oxygen use at home. Patient denies taking any diet/weight loss medications or blood thinners. Patient is not being treated for MRSA or C-diff. Patient is aware of our care-partner policy and WQJIJ-79 safety protocol. EMMI education assisgned to the patient for the procedure, sent to MyChart.   COVID-19 vaccines x3 completed on 01/30/20, per patient.

## 2020-03-20 ENCOUNTER — Ambulatory Visit (AMBULATORY_SURGERY_CENTER): Payer: PPO | Admitting: Gastroenterology

## 2020-03-20 ENCOUNTER — Other Ambulatory Visit: Payer: Self-pay

## 2020-03-20 ENCOUNTER — Encounter: Payer: Self-pay | Admitting: Gastroenterology

## 2020-03-20 VITALS — BP 116/68 | HR 82 | Temp 97.0°F | Resp 1 | Ht 70.0 in | Wt 192.0 lb

## 2020-03-20 DIAGNOSIS — D122 Benign neoplasm of ascending colon: Secondary | ICD-10-CM | POA: Diagnosis not present

## 2020-03-20 DIAGNOSIS — Z86010 Personal history of colonic polyps: Secondary | ICD-10-CM | POA: Diagnosis not present

## 2020-03-20 MED ORDER — SODIUM CHLORIDE 0.9 % IV SOLN: 500.0000 mL | Freq: Once | INTRAVENOUS | Status: DC

## 2020-03-20 NOTE — Patient Instructions (Signed)
Handouts given:  Polyps, Diverticulosis, Hemorrhoids Resume previous diet  continue current medications Await pathology results  YOU HAD AN ENDOSCOPIC PROCEDURE TODAY AT [facility]:   Refer to the procedure report that was given to you for any specific questions about what was found during the examination.  If the procedure report does not answer your questions, please call your gastroenterologist to clarify.  If you requested that your care partner not be given the details of your procedure findings, then the procedure report has been included in a sealed envelope for you to review at your convenience later.  YOU SHOULD EXPECT: Some feelings of bloating in the abdomen. Passage of more gas than usual.  Walking can help get rid of the air that was put into your GI tract during the procedure and reduce the bloating. If you had a lower endoscopy (such as a colonoscopy or flexible sigmoidoscopy) you may notice spotting of blood in your stool or on the toilet paper. If you underwent a bowel prep for your procedure, you may not have a normal bowel movement for a few days.  Please Note:  You might notice some irritation and congestion in your nose or some drainage.  This is from the oxygen used during your procedure.  There is no need for concern and it should clear up in a day or so.  SYMPTOMS TO REPORT IMMEDIATELY:   Following lower endoscopy (colonoscopy or flexible sigmoidoscopy):  Excessive amounts of blood in the stool  Significant tenderness or worsening of abdominal pains  Swelling of the abdomen that is new, acute  Fever of 100F or higher  For urgent or emergent issues, a gastroenterologist can be reached at any hour by calling [phone]. Do not use MyChart messaging for urgent concerns.    DIET:  We do recommend a small meal at first, but then you may proceed to your regular diet.  Drink plenty of fluids but you should avoid alcoholic beverages for 24  hours.  ACTIVITY:  You should plan to take it easy for the rest of today and you should NOT DRIVE or use heavy machinery until tomorrow (because of the sedation medicines used during the test).    FOLLOW UP: Our staff will call the number listed on your records 48-72 hours following your procedure to check on you and address any questions or concerns that you may have regarding the information given to you following your procedure. If we do not reach you, we will leave a message.  We will attempt to reach you two times.  During this call, we will ask if you have developed any symptoms of COVID 19. If you develop any symptoms (ie: fever, flu-like symptoms, shortness of breath, cough etc.) before then, please call [phone].  If you test positive for Covid 19 in the 2 weeks post procedure, please call and report this information to [place].    If any biopsies were taken you will be contacted by phone or by letter within the next 1-3 weeks.  Please call us at [phone] if you have not heard about the biopsies in 3 weeks.    SIGNATURES/CONFIDENTIALITY: You and/or your care partner have signed paperwork which will be entered into your electronic medical record.  These signatures attest to the fact that that the information above on your After Visit Summary has been reviewed and is understood.  Full responsibility of the confidentiality of this discharge information lies with you and/or your care-partner.

## 2020-03-20 NOTE — Progress Notes (Signed)
PT taken to PACU. Monitors in place. VSS. Report given to RN. 

## 2020-03-20 NOTE — Op Note (Signed)
[place] Patient Name: Louis Carrillo Procedure Date: 03/20/2020 7:42 AM MRN: [id number] Endoscopist: Jackquline Denmark , MD Age: 74 Referring MD:  Date of Birth: 07/26/1945 Gender: Male Account #: [id number] Procedure:                Colonoscopy Indications:              High risk colon cancer surveillance: Personal                            history of colonic polyps Medicines:                Monitored Anesthesia Care Procedure:                Pre-Anesthesia Assessment:                           - Prior to the procedure, a History and Physical                            was performed, and patient medications and                            allergies were reviewed. The patient's tolerance of                            previous anesthesia was also reviewed. The risks                            and benefits of the procedure and the sedation                            options and risks were discussed with the patient.                            All questions were answered, and informed consent                            was obtained. Prior Anticoagulants: The patient has                            taken no previous anticoagulant or antiplatelet                            agents. ASA Grade Assessment: III - A patient with                            severe systemic disease. After reviewing the risks                            and benefits, the patient was deemed in                            satisfactory condition to undergo the procedure.  After obtaining informed consent, the colonoscope                            was passed under direct vision. Throughout the                            procedure, the patient's blood pressure, pulse, and                            oxygen saturations were monitored continuously. The                            Colonoscope was introduced through the anus and                            advanced to the the cecum,  identified by                            appendiceal orifice and ileocecal valve. The                            colonoscopy was performed without difficulty. The                            patient tolerated the procedure well. The quality                            of the bowel preparation was good. The ileocecal                            valve, appendiceal orifice, and rectum were                            photographed. Scope In: 8:16:07 AM Scope Out: 8:30:49 AM Scope Withdrawal Time: 0 hours 11 minutes 45 seconds  Total Procedure Duration: 0 hours 14 minutes 42 seconds  Findings:                 A 6 mm polyp was found in the proximal ascending                            colon. The polyp was sessile. The polyp was removed                            with a cold snare. Resection and retrieval were                            complete.                           A few small-mouthed diverticula were found in the                            sigmoid colon.  Non-bleeding external and internal hemorrhoids were                            found during retroflexion and during perianal exam.                            The hemorrhoids were small.                           The exam was otherwise without abnormality on                            direct and retroflexion views. Complications:            No immediate complications. Estimated Blood Loss:     Estimated blood loss: none. Impression:               - One 6 mm polyp in the proximal ascending colon,                            removed with a cold snare. Resected and retrieved.                           - Diverticulosis in the sigmoid colon.                           - Non-bleeding external and internal hemorrhoids.                           - The examination was otherwise normal on direct                            and retroflexion views. Recommendation:           - Patient has a contact number available for                             emergencies. The signs and symptoms of potential                            delayed complications were discussed with the                            patient. Return to normal activities tomorrow.                            Written discharge instructions were provided to the                            patient.                           - Resume previous diet.                           - Continue present medications.                           -  Await pathology results.                           - Repeat colonoscopy for surveillance based on                            pathology results.                           - The findings and recommendations were discussed                            with Freddie Breech, MD 03/20/2020 8:36:45 AM This report has been signed electronically.

## 2020-03-20 NOTE — Progress Notes (Signed)
VS by CW  No changes to medical or social hx since previsit.  

## 2020-03-22 ENCOUNTER — Telehealth: Payer: Self-pay

## 2020-03-22 NOTE — Telephone Encounter (Signed)
  Follow up Call-  Call back number 03/20/2020  Post procedure Call Back phone  # [phone]  Permission to leave phone message Yes  Some recent data might be hidden     Patient questions:  Do you have a fever, pain , or abdominal swelling? No. Pain Score  0 *  Have you tolerated food without any problems? Yes.    Have you been able to return to your normal activities? Yes.    Do you have any questions about your discharge instructions: Diet   No. Medications  No. Follow up visit  No.  Do you have questions or concerns about your Care? No.  Actions: * If pain score is 4 or above: No action needed, pain <4.  1. Have you developed a fever since your procedure? no  2.   Have you had an respiratory symptoms (SOB or cough) since your procedure? no  3.   Have you tested positive for COVID 19 since your procedure no  4.   Have you had any family members/close contacts diagnosed with the COVID 19 since your procedure?  no   If yes to any of these questions please route to Joylene John, RN and Joella Prince, RN

## 2020-03-26 ENCOUNTER — Encounter: Payer: Self-pay | Admitting: Gastroenterology

## 2020-04-10 DIAGNOSIS — Z8551 Personal history of malignant neoplasm of bladder: Secondary | ICD-10-CM

## 2020-04-10 DIAGNOSIS — N400 Enlarged prostate without lower urinary tract symptoms: Secondary | ICD-10-CM

## 2020-04-10 DIAGNOSIS — N5203 Combined arterial insufficiency and corporo-venous occlusive erectile dysfunction: Secondary | ICD-10-CM

## 2020-04-10 HISTORY — DX: Benign prostatic hyperplasia without lower urinary tract symptoms: N40.0

## 2020-04-10 HISTORY — DX: Personal history of malignant neoplasm of bladder: Z85.51

## 2020-04-10 HISTORY — DX: Combined arterial insufficiency and corporo-venous occlusive erectile dysfunction: N52.03

## 2020-04-11 ENCOUNTER — Other Ambulatory Visit: Payer: Self-pay

## 2020-04-11 ENCOUNTER — Telehealth: Payer: Self-pay | Admitting: Cardiology

## 2020-04-11 MED ORDER — LOSARTAN POTASSIUM-HCTZ 50-12.5 MG PO TABS: 1.0000 | ORAL_TABLET | Freq: Every day | ORAL | 3 refills | Status: DC

## 2020-04-11 MED ORDER — LIVALO 2 MG PO TABS: 2.0000 mg | ORAL_TABLET | Freq: Every day | ORAL | 3 refills | Status: DC

## 2020-04-11 NOTE — Telephone Encounter (Signed)
Pt states that his prescriptions for Livalo, Losartan, and Ranolazine need to be faxed to the [place] @ [phone] ATTN Dr. Duke Salvia - he went up there yesterday and they said they had no record of it.  Please advise- best number of contact [phone] to contact pt once completed. Thank you!  [place]

## 2020-04-11 NOTE — Telephone Encounter (Signed)
Spoke with patient who states that he does not have chest pain and if he does not need the Ranexa he would rather stop it. Dr Geraldo Pitter advised for patient to attempt to stop the medication. Pt verbalized understanding and had no additional questions. RX faxed to [facility] as requested.

## 2020-04-12 DIAGNOSIS — E782 Mixed hyperlipidemia: Secondary | ICD-10-CM | POA: Diagnosis not present

## 2020-04-13 LAB — HEPATIC FUNCTION PANEL
ALT: 24 IU/L (ref 0–44)
AST: 16 IU/L (ref 0–40)
Albumin: 4.2 g/dL (ref 3.7–4.7)
Alkaline Phosphatase: 74 IU/L (ref 44–121)
Bilirubin Total: 0.5 mg/dL (ref 0.0–1.2)
Bilirubin, Direct: 0.17 mg/dL (ref 0.00–0.40)
Total Protein: 6.2 g/dL (ref 6.0–8.5)

## 2020-04-13 LAB — LIPID PANEL
Chol/HDL Ratio: 3.8 ratio (ref 0.0–5.0)
Cholesterol, Total: 194 mg/dL (ref 100–199)
HDL: 51 mg/dL (ref >39)
LDL Chol Calc (NIH): 130 mg/dL — ABNORMAL HIGH (ref 0–99)
Triglycerides: 70 mg/dL (ref 0–149)
VLDL Cholesterol Cal: 13 mg/dL (ref 5–40)

## 2020-04-16 ENCOUNTER — Other Ambulatory Visit: Payer: Self-pay

## 2020-04-16 DIAGNOSIS — E782 Mixed hyperlipidemia: Secondary | ICD-10-CM

## 2020-04-16 DIAGNOSIS — Z5181 Encounter for therapeutic drug level monitoring: Secondary | ICD-10-CM

## 2020-04-16 MED ORDER — LIVALO 2 MG PO TABS: 4.0000 mg | ORAL_TABLET | Freq: Every day | ORAL | 3 refills | Status: DC

## 2020-05-06 ENCOUNTER — Telehealth: Payer: Self-pay | Admitting: Cardiology

## 2020-05-06 NOTE — Telephone Encounter (Signed)
Per request, most recent ov note faxed to [facility] [employer]. Newt [facility] [phone])

## 2020-05-13 DIAGNOSIS — R062 Wheezing: Secondary | ICD-10-CM | POA: Diagnosis not present

## 2020-05-13 DIAGNOSIS — Z20828 Contact with and (suspected) exposure to other viral communicable diseases: Secondary | ICD-10-CM | POA: Diagnosis not present

## 2020-05-13 DIAGNOSIS — J209 Acute bronchitis, unspecified: Secondary | ICD-10-CM | POA: Diagnosis not present

## 2020-05-13 DIAGNOSIS — R051 Acute cough: Secondary | ICD-10-CM | POA: Diagnosis not present

## 2020-05-13 DIAGNOSIS — R509 Fever, unspecified: Secondary | ICD-10-CM | POA: Diagnosis not present

## 2020-05-21 ENCOUNTER — Other Ambulatory Visit: Payer: Self-pay

## 2020-05-21 DIAGNOSIS — E785 Hyperlipidemia, unspecified: Secondary | ICD-10-CM | POA: Insufficient documentation

## 2020-05-24 ENCOUNTER — Ambulatory Visit: Payer: PPO | Admitting: Cardiology

## 2020-05-24 ENCOUNTER — Other Ambulatory Visit: Payer: Self-pay

## 2020-05-24 ENCOUNTER — Encounter: Payer: Self-pay | Admitting: Cardiology

## 2020-05-24 VITALS — BP 132/72 | HR 60 | Ht 70.0 in | Wt 198.0 lb

## 2020-05-24 DIAGNOSIS — I2510 Atherosclerotic heart disease of native coronary artery without angina pectoris: Secondary | ICD-10-CM | POA: Diagnosis not present

## 2020-05-24 DIAGNOSIS — Z87891 Personal history of nicotine dependence: Secondary | ICD-10-CM | POA: Diagnosis not present

## 2020-05-24 DIAGNOSIS — E782 Mixed hyperlipidemia: Secondary | ICD-10-CM

## 2020-05-24 DIAGNOSIS — I10 Essential (primary) hypertension: Secondary | ICD-10-CM

## 2020-05-24 NOTE — Patient Instructions (Signed)
Medication Instructions:  No medication changes. *If you need a refill on your cardiac medications before your next appointment, please call your pharmacy*   Lab Work: Your physician recommends that you return for lab work in: 1 month (06/24/2020). You need to have labs done when you are fasting.  You can come Monday through Friday 8:30 am to 12:00 pm and 1:15 to 4:30. You do not need to make an appointment as the order has already been placed. The labs you are going to have done are BMET, LFT and Lipids.  If you have labs (blood work) drawn today and your tests are completely normal, you will receive your results only by: Marland Kitchen MyChart Message (if you have MyChart) OR . A paper copy in the mail If you have any lab test that is abnormal or we need to change your treatment, we will call you to review the results.   Testing/Procedures: None ordered   Follow-Up: At [facility], you and your health needs are our priority.  As part of our continuing mission to provide you with exceptional heart care, we have created designated Provider Care Teams.  These Care Teams include your primary Cardiologist (physician) and Advanced Practice Providers (APPs -  Physician Assistants and Nurse Practitioners) who all work together to provide you with the care you need, when you need it.  We recommend signing up for the patient portal called "MyChart".  Sign up information is provided on this After Visit Summary.  MyChart is used to connect with patients for Virtual Visits (Telemedicine).  Patients are able to view lab/test results, encounter notes, upcoming appointments, etc.  Non-urgent messages can be sent to your provider as well.   To learn more about what you can do with MyChart, go to [email or web address].    Your next appointment:   4 month(s)  The format for your next appointment:   In Person  Provider:   Jyl Heinz, MD   Other Instructions NA

## 2020-05-24 NOTE — Progress Notes (Signed)
Cardiology Office Note:    Date:  05/24/2020   ID:  Louis Carrillo, DOB 19-Aug-1945, MRN [id number]  PCP:  Elenore Paddy, NP  Cardiologist:  Jenean Lindau, MD   Referring MD: Elenore Paddy, NP    ASSESSMENT:    1. Coronary artery disease involving native coronary artery of native heart without angina pectoris   2. Essential hypertension   3. Mixed hyperlipidemia   4. Ex-smoker    PLAN:    In order of problems listed above:  1. Coronary artery disease: Secondary prevention stressed to the patient.  Importance of compliance with diet medication stressed and he vocalized understanding.  He is walking well and exercising regularly and I congratulated him about this. 2. Essential hypertension: Blood pressure stable and diet was emphasized.  Lifestyle modification was stressed. 3. Mixed dyslipidemia: Lipids were reviewed.  He was advised to take Livalo 2 mg alternating with 4 mg every other day.  He promises to comply.  He was told to come back in a month for follow-up for liver lipid check and he agrees to take so.  He tells me that if he takes 4 mg every day he developed joint pains.  I will try Nexletol or Nexlezet in the next visit if his lipids are not up to the mark. 4. Patient will be seen in follow-up appointment in 6 months or earlier if the patient has any concerns  Medication Adjustments/Labs and Tests Ordered: Current medicines are reviewed at length with the patient today.  Concerns regarding medicines are outlined above.  No orders of the defined types were placed in this encounter.  No orders of the defined types were placed in this encounter.    No chief complaint on file.    History of Present Illness:    Louis Carrillo is a 75 y.o. male.  Patient has past medical history of coronary artery disease and medical management was recommended, essential hypertension and dyslipidemia.  She denies any problems at this time and takes care of activities of daily  living.  No chest pain orthopnea or PND.  He is walking up to half an hour a day without any problems.  He is using statin medications but not completely as recommended.  At the time of my evaluation is alert awake oriented and in no distress.  Past Medical History:  Diagnosis Date  . Abnormal nuclear stress test 11/09/2019  . BPH without obstruction/lower urinary tract symptoms 04/10/2020  . CAD (coronary artery disease) 12/19/2019  . Cancer [facility] 2014   bladder cancer   . Chest tightness 10/10/2019  . 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  . Essential hypertension 10/10/2019  . Ex-smoker 10/10/2019  . History of bladder cancer 04/10/2020  . Hyperlipidemia   . Mixed dyslipidemia 11/09/2019    Past Surgical History:  Procedure Laterality Date  . BLADDER TUMOR EXCISION  2014   x3  . COLONOSCOPY  12/24/2014   Gupta-polyps  . INGUINAL HERNIA REPAIR  age 24   pt unsure what surgery  . POLYPECTOMY    . WOUND DEBRIDEMENT  1969    Current Medications: No outpatient medications have been marked as taking for the 05/24/20 encounter (Office Visit) with Sabrea Sankey, Reita Cliche, MD.     Allergies:   Patient has no known allergies.   Social History   Socioeconomic History  . Marital status: Married    Spouse name: Not on file  . Number of children: Not on file  .  Years of education: Not on file  . Highest education level: Not on file  Occupational History  . Not on file  Tobacco Use  . Smoking status: Former Smoker    Types: Cigarettes    Quit date: 07/27/1998    Years since quitting: 21.8  . Smokeless tobacco: Never Used  Vaping Use  . Vaping Use: Never used  Substance and Sexual Activity  . Alcohol use: Yes    Alcohol/week: 15.0 standard drinks    Types: 15 Cans of beer per week  . Drug use: No  . Sexual activity: Not on file  Other Topics Concern  . Not on file  Social History Narrative  . Not on file   Social Determinants of  Health   Financial Resource Strain: Not on file  Food Insecurity: Not on file  Transportation Needs: Not on file  Physical Activity: Not on file  Stress: Not on file  Social Connections: Not on file     Family History: The patient's family history includes Alcohol abuse in his paternal grandfather; Heart attack in his maternal grandmother; Hypertension in his brother, father, and mother. There is no history of Colon cancer, Colon polyps, Esophageal cancer, Rectal cancer, or Stomach cancer.  ROS:   Please see the history of present illness.    All other systems reviewed and are negative.  EKGs/Labs/Other Studies Reviewed:    The following studies were reviewed today: FFRCT ANALYSIS  FINDINGS: FFRct analysis was performed on the original cardiac CT angiogram dataset. Diagrammatic representation of the FFRct analysis is provided in a separate PDF document in PACS. This dictation was created using the PDF document and an interactive 3D model of the results. 3D model is not available in the EMR/PACS. Normal FFR range is >0.80.  1. Left Main: No significant stenosis.  LM FFR=0.99.  2. LAD: Possible flow limiting lesion in distal LAD and Diagonal. Proximal FFR = 0.99, Mid FFR = 0.93, Distal FFR = 0.72.  3. LCX: No significant stenosis. Proximal FFR=0.99, Mid FFR = 0.90, Distal FFR = 0.87.  4. Ramus: No significant stenosis. Proximal FFR=0.98. Mid and distal FFR not calculated.  5. RCA: Possible flow limiting lesion in distal RCA. Proximal FFR = 0.99, Mid FFR = 0.94, Distal FFR = 0.63  IMPRESSION: 1. CT FFR flow analysis demonstrates possible flow limiting lesions in distal RCA, LAD and Diagonal. There is gradual reduction in flow in the mid to distal LAD possibly representing small vessel disease vs. Tapering of vessel. Possible flow limiting lesion in mid diagonal #1. The distal RCA and PDA are small vessels and reduced flow in these territories is likely related to  small vessel disease.  2.  Recommend medical management.  Traci Turner   Electronically Signed   By: Fransico Him   On: 12/10/2019 19:20   Recent Labs: 02/15/2020: BUN 20; Creatinine, Ser 1.16; Potassium 4.5; Sodium 138 04/12/2020: ALT 24  Recent Lipid Panel    Component Value Date/Time   CHOL 194 04/12/2020 0826   TRIG 70 04/12/2020 0826   HDL 51 04/12/2020 0826   CHOLHDL 3.8 04/12/2020 0826   LDLCALC 130 (H) 04/12/2020 0826    Physical Exam:    VS:  BP 132/72   Pulse 60   Ht 5\' 10"  (1.778 m)   Wt 198 lb (89.8 kg)   SpO2 98%   BMI 28.41 kg/m     Wt Readings from Last 3 Encounters:  05/24/20 198 lb (89.8 kg)  03/20/20 192 lb (  87.1 kg)  03/06/20 192 lb (87.1 kg)     GEN: Patient is in no acute distress HEENT: Normal NECK: No JVD; No carotid bruits LYMPHATICS: No lymphadenopathy CARDIAC: Hear sounds regular, 2/6 systolic murmur at the apex. RESPIRATORY:  Clear to auscultation without rales, wheezing or rhonchi  ABDOMEN: Soft, non-tender, non-distended MUSCULOSKELETAL:  No edema; No deformity  SKIN: Warm and dry NEUROLOGIC:  Alert and oriented x 3 PSYCHIATRIC:  Normal affect   Signed, Jenean Lindau, MD  05/24/2020 8:22 AM    [facility]

## 2020-06-24 DIAGNOSIS — I10 Essential (primary) hypertension: Secondary | ICD-10-CM | POA: Diagnosis not present

## 2020-06-24 DIAGNOSIS — I2510 Atherosclerotic heart disease of native coronary artery without angina pectoris: Secondary | ICD-10-CM | POA: Diagnosis not present

## 2020-06-24 DIAGNOSIS — E782 Mixed hyperlipidemia: Secondary | ICD-10-CM | POA: Diagnosis not present

## 2020-06-25 DIAGNOSIS — H5340 Unspecified visual field defects: Secondary | ICD-10-CM | POA: Diagnosis not present

## 2020-06-25 DIAGNOSIS — H2513 Age-related nuclear cataract, bilateral: Secondary | ICD-10-CM | POA: Diagnosis not present

## 2020-06-25 LAB — LIPID PANEL
Chol/HDL Ratio: 2.8 ratio (ref 0.0–5.0)
Cholesterol, Total: 154 mg/dL (ref 100–199)
HDL: 55 mg/dL (ref >39)
LDL Chol Calc (NIH): 88 mg/dL (ref 0–99)
Triglycerides: 50 mg/dL (ref 0–149)
VLDL Cholesterol Cal: 11 mg/dL (ref 5–40)

## 2020-06-25 LAB — HEPATIC FUNCTION PANEL
ALT: 17 IU/L (ref 0–44)
AST: 17 IU/L (ref 0–40)
Albumin: 4.3 g/dL (ref 3.7–4.7)
Alkaline Phosphatase: 77 IU/L (ref 44–121)
Bilirubin Total: 0.4 mg/dL (ref 0.0–1.2)
Bilirubin, Direct: 0.14 mg/dL (ref 0.00–0.40)
Total Protein: 6.5 g/dL (ref 6.0–8.5)

## 2020-06-25 LAB — BASIC METABOLIC PANEL
BUN/Creatinine Ratio: 18 (ref 10–24)
BUN: 18 mg/dL (ref 8–27)
CO2: 27 mmol/L (ref 20–29)
Calcium: 9.2 mg/dL (ref 8.6–10.2)
Chloride: 99 mmol/L (ref 96–106)
Creatinine, Ser: 0.98 mg/dL (ref 0.76–1.27)
GFR calc Af Amer: 88 mL/min/{1.73_m2} (ref >59)
GFR calc non Af Amer: 76 mL/min/{1.73_m2} (ref >59)
Glucose: 95 mg/dL (ref 65–99)
Potassium: 4.6 mmol/L (ref 3.5–5.2)
Sodium: 138 mmol/L (ref 134–144)

## 2020-07-05 ENCOUNTER — Other Ambulatory Visit: Payer: Self-pay

## 2020-07-05 ENCOUNTER — Telehealth: Payer: Self-pay | Admitting: Cardiology

## 2020-07-05 DIAGNOSIS — E782 Mixed hyperlipidemia: Secondary | ICD-10-CM

## 2020-07-05 MED ORDER — PITAVASTATIN CALCIUM 4 MG PO TABS: 4.0000 mg | ORAL_TABLET | Freq: Every day | ORAL | 3 refills | Status: DC

## 2020-07-05 NOTE — Telephone Encounter (Signed)
Pt needs refill on Livalo. Was told to double up on medication so ran out in half the time and the pharmacy said it's not due.   Would also like to switch pharmacies to [facility] - [phone]  Best number to reach patient- [phone]  Thank you

## 2020-07-05 NOTE — Telephone Encounter (Signed)
Pt aware that the RX has been sent into the pharmacy as a 4 mg tablet and he will only take one. Pt verbalized understanding and had no additional questions.

## 2020-07-13 DIAGNOSIS — H6990 Unspecified Eustachian tube disorder, unspecified ear: Secondary | ICD-10-CM | POA: Diagnosis not present

## 2020-07-13 DIAGNOSIS — R067 Sneezing: Secondary | ICD-10-CM | POA: Diagnosis not present

## 2020-07-13 DIAGNOSIS — R519 Headache, unspecified: Secondary | ICD-10-CM | POA: Diagnosis not present

## 2020-07-13 DIAGNOSIS — R0982 Postnasal drip: Secondary | ICD-10-CM | POA: Diagnosis not present

## 2020-07-13 DIAGNOSIS — J0100 Acute maxillary sinusitis, unspecified: Secondary | ICD-10-CM | POA: Diagnosis not present

## 2020-09-12 DIAGNOSIS — Z20828 Contact with and (suspected) exposure to other viral communicable diseases: Secondary | ICD-10-CM | POA: Diagnosis not present

## 2020-09-12 DIAGNOSIS — R051 Acute cough: Secondary | ICD-10-CM | POA: Diagnosis not present

## 2020-09-19 DIAGNOSIS — C44311 Basal cell carcinoma of skin of nose: Secondary | ICD-10-CM | POA: Diagnosis not present

## 2020-09-19 DIAGNOSIS — C44519 Basal cell carcinoma of skin of other part of trunk: Secondary | ICD-10-CM | POA: Diagnosis not present

## 2020-09-19 DIAGNOSIS — D485 Neoplasm of uncertain behavior of skin: Secondary | ICD-10-CM | POA: Diagnosis not present

## 2020-09-19 DIAGNOSIS — L821 Other seborrheic keratosis: Secondary | ICD-10-CM | POA: Diagnosis not present

## 2020-09-19 DIAGNOSIS — D692 Other nonthrombocytopenic purpura: Secondary | ICD-10-CM | POA: Diagnosis not present

## 2020-09-19 DIAGNOSIS — Z85828 Personal history of other malignant neoplasm of skin: Secondary | ICD-10-CM | POA: Diagnosis not present

## 2020-09-19 DIAGNOSIS — L570 Actinic keratosis: Secondary | ICD-10-CM | POA: Diagnosis not present

## 2020-09-19 DIAGNOSIS — C44619 Basal cell carcinoma of skin of left upper limb, including shoulder: Secondary | ICD-10-CM | POA: Diagnosis not present

## 2020-09-25 DIAGNOSIS — K279 Peptic ulcer, site unspecified, unspecified as acute or chronic, without hemorrhage or perforation: Secondary | ICD-10-CM

## 2020-09-25 DIAGNOSIS — N400 Enlarged prostate without lower urinary tract symptoms: Secondary | ICD-10-CM

## 2020-09-25 DIAGNOSIS — R519 Headache, unspecified: Secondary | ICD-10-CM

## 2020-09-25 DIAGNOSIS — M79673 Pain in unspecified foot: Secondary | ICD-10-CM

## 2020-09-25 DIAGNOSIS — Z23 Encounter for immunization: Secondary | ICD-10-CM

## 2020-09-25 DIAGNOSIS — I2510 Atherosclerotic heart disease of native coronary artery without angina pectoris: Secondary | ICD-10-CM

## 2020-09-25 DIAGNOSIS — F17200 Nicotine dependence, unspecified, uncomplicated: Secondary | ICD-10-CM

## 2020-09-25 DIAGNOSIS — I10 Essential (primary) hypertension: Secondary | ICD-10-CM

## 2020-09-25 DIAGNOSIS — H9190 Unspecified hearing loss, unspecified ear: Secondary | ICD-10-CM

## 2020-09-25 HISTORY — DX: Nicotine dependence, unspecified, uncomplicated: F17.200

## 2020-09-25 HISTORY — DX: Pain in unspecified foot: M79.673

## 2020-09-25 HISTORY — DX: Benign prostatic hyperplasia without lower urinary tract symptoms: N40.0

## 2020-09-25 HISTORY — DX: Headache, unspecified: R51.9

## 2020-09-25 HISTORY — DX: Unspecified hearing loss, unspecified ear: H91.90

## 2020-09-25 HISTORY — DX: Essential (primary) hypertension: I10

## 2020-09-25 HISTORY — DX: Atherosclerotic heart disease of native coronary artery without angina pectoris: I25.10

## 2020-09-25 HISTORY — DX: Peptic ulcer, site unspecified, unspecified as acute or chronic, without hemorrhage or perforation: K27.9

## 2020-09-25 HISTORY — DX: Encounter for immunization: Z23

## 2020-09-27 ENCOUNTER — Ambulatory Visit: Payer: PPO | Admitting: Cardiology

## 2020-09-27 ENCOUNTER — Other Ambulatory Visit: Payer: Self-pay

## 2020-09-27 ENCOUNTER — Encounter: Payer: Self-pay | Admitting: Cardiology

## 2020-09-27 VITALS — BP 138/72 | HR 62 | Ht 70.0 in | Wt 193.6 lb

## 2020-09-27 DIAGNOSIS — I10 Essential (primary) hypertension: Secondary | ICD-10-CM

## 2020-09-27 DIAGNOSIS — E782 Mixed hyperlipidemia: Secondary | ICD-10-CM | POA: Diagnosis not present

## 2020-09-27 DIAGNOSIS — I2510 Atherosclerotic heart disease of native coronary artery without angina pectoris: Secondary | ICD-10-CM | POA: Diagnosis not present

## 2020-09-27 NOTE — Progress Notes (Signed)
Cardiology Office Note:    Date:  09/27/2020   ID:  Louis Carrillo, DOB [birth year], MRN [id number]  PCP:  Elenore Paddy, NP  Cardiologist:  Jenean Lindau, MD   Referring MD: Elenore Paddy, NP    ASSESSMENT:    1. Coronary artery disease involving native coronary artery of native heart without angina pectoris   2. Essential (primary) hypertension   3. Mixed hyperlipidemia    PLAN:    In order of problems listed above:  1. Coronary artery disease: Secondary prevention stressed with the patient.  Importance of compliance with diet medication stressed any vocalized understanding.  He was advised to walk at least half an hour a day 5 days a week. 2. Essential hypertension: Blood pressure stable and diet was emphasized. 3. Mixed dyslipidemia: Lipids were reviewed I am happy with his progress.  Diet was emphasized and I told him that his goal LDL must be less than 70 and he is agreeable. 4. Patient will be seen in follow-up appointment in 6 months or earlier if the patient has any concerns    Medication Adjustments/Labs and Tests Ordered: Current medicines are reviewed at length with the patient today.  Concerns regarding medicines are outlined above.  No orders of the defined types were placed in this encounter.  No orders of the defined types were placed in this encounter.    No chief complaint on file.    History of Present Illness:    Louis Carrillo is a 75 y.o. male.  Patient has past medical history of coronary artery disease, essential hypertension and dyslipidemia.  He denies any problems at this time and takes care of activities of daily living.  No chest pain orthopnea or PND.  He is tolerating pitavastatin fine without any problems.  At the time of my evaluation, the patient is alert awake oriented and in no distress.  He walks on a regular basis.  Past Medical History:  Diagnosis Date  . Abnormal nuclear stress test 11/09/2019  . Benign hypertension  09/25/2020  . Benign prostatic hyperplasia 09/25/2020  . BPH without obstruction/lower urinary tract symptoms 04/10/2020  . CAD (coronary artery disease) 12/19/2019  . Cancer [facility]) 2014   bladder cancer   . Chest tightness 10/10/2019  . 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  . Coronary arteriosclerosis 09/25/2020  . Encounter for immunization 09/25/2020  . Essential (primary) hypertension 10/10/2019  . Essential hypertension 10/10/2019  . Ex-smoker 10/10/2019  . Foot pain 09/25/2020  . Headache 09/25/2020  . Hearing loss 09/25/2020  . History of bladder cancer 04/10/2020  . Hyperlipidemia   . Hypertrophy of prostate without urinary obstruction and other lower urinary tract symptoms (LUTS) 09/25/2020  . Mixed dyslipidemia 11/09/2019  . Peptic ulcer 09/25/2020  . Tobacco use disorder 09/25/2020    Past Surgical History:  Procedure Laterality Date  . BLADDER TUMOR EXCISION  2014   x3  . COLONOSCOPY  12/24/2014   Gupta-polyps  . INGUINAL HERNIA REPAIR  age 67   pt unsure what surgery  . POLYPECTOMY    . WOUND DEBRIDEMENT  1969    Current Medications: Current Meds  Medication Sig  . Garlic (GARLIQUE PO) Take 1 tablet by mouth daily.  Marland Kitchen losartan-hydrochlorothiazide (HYZAAR) 50-12.5 MG tablet Take 1 tablet by mouth daily.  . nitroGLYCERIN (NITROSTAT) 0.4 MG SL tablet Place 0.4 mg under the tongue every 5 (five) minutes as needed for chest pain.  . Pitavastatin Calcium 4 MG TABS Take  1 tablet (4 mg total) by mouth daily.  . TURMERIC PO Take 1,500 mg by mouth daily.     Allergies:   Atorvastatin, Citalopram, Omeprazole, and Ranitidine   Social History   Socioeconomic History  . Marital status: Married    Spouse name: Not on file  . Number of children: Not on file  . Years of education: Not on file  . Highest education level: Not on file  Occupational History  . Not on file  Tobacco Use  . Smoking status: Former Smoker    Types:  Cigarettes    Quit date: 07/27/1998    Years since quitting: 22.1  . Smokeless tobacco: Never Used  Vaping Use  . Vaping Use: Never used  Substance and Sexual Activity  . Alcohol use: Yes    Alcohol/week: 15.0 standard drinks    Types: 15 Cans of beer per week  . Drug use: No  . Sexual activity: Not on file  Other Topics Concern  . Not on file  Social History Narrative  . Not on file   Social Determinants of Health   Financial Resource Strain: Not on file  Food Insecurity: Not on file  Transportation Needs: Not on file  Physical Activity: Not on file  Stress: Not on file  Social Connections: Not on file     Family History: The patient's family history includes Alcohol abuse in his paternal grandfather; Heart attack in his maternal grandmother; Hypertension in his brother, father, and mother. There is no history of Colon cancer, Colon polyps, Esophageal cancer, Rectal cancer, or Stomach cancer.  ROS:   Please see the history of present illness.    All other systems reviewed and are negative.  EKGs/Labs/Other Studies Reviewed:    The following studies were reviewed today: EKG reveals sinus rhythm and nonspecific ST-T changes.   Recent Labs: 06/24/2020: ALT 17; BUN 18; Creatinine, Ser 0.98; Potassium 4.6; Sodium 138  Recent Lipid Panel    Component Value Date/Time   CHOL 154 06/24/2020 0808   TRIG 50 06/24/2020 0808   HDL 55 06/24/2020 0808   CHOLHDL 2.8 06/24/2020 0808   LDLCALC 88 06/24/2020 0808    Physical Exam:    VS:  BP 138/72   Pulse 62   Ht 5\' 10"  (1.778 m)   Wt 193 lb 9.6 oz (87.8 kg)   SpO2 97%   BMI 27.78 kg/m     Wt Readings from Last 3 Encounters:  09/27/20 193 lb 9.6 oz (87.8 kg)  05/24/20 198 lb (89.8 kg)  03/20/20 192 lb (87.1 kg)     GEN: Patient is in no acute distress HEENT: Normal NECK: No JVD; No carotid bruits LYMPHATICS: No lymphadenopathy CARDIAC: Hear sounds regular, 2/6 systolic murmur at the apex. RESPIRATORY:  Clear to  auscultation without rales, wheezing or rhonchi  ABDOMEN: Soft, non-tender, non-distended MUSCULOSKELETAL:  No edema; No deformity  SKIN: Warm and dry NEUROLOGIC:  Alert and oriented x 3 PSYCHIATRIC:  Normal affect   Signed, Jenean Lindau, MD  09/27/2020 8:50 AM    [facility]

## 2020-09-27 NOTE — Patient Instructions (Signed)

## 2020-10-09 DIAGNOSIS — H2511 Age-related nuclear cataract, right eye: Secondary | ICD-10-CM | POA: Diagnosis not present

## 2020-10-09 DIAGNOSIS — Z01818 Encounter for other preprocedural examination: Secondary | ICD-10-CM | POA: Diagnosis not present

## 2020-10-09 DIAGNOSIS — H52221 Regular astigmatism, right eye: Secondary | ICD-10-CM | POA: Diagnosis not present

## 2020-10-10 DIAGNOSIS — C44311 Basal cell carcinoma of skin of nose: Secondary | ICD-10-CM | POA: Diagnosis not present

## 2020-10-10 DIAGNOSIS — Z85828 Personal history of other malignant neoplasm of skin: Secondary | ICD-10-CM | POA: Diagnosis not present

## 2020-10-11 DIAGNOSIS — H5340 Unspecified visual field defects: Secondary | ICD-10-CM | POA: Diagnosis not present

## 2020-10-14 DIAGNOSIS — H52201 Unspecified astigmatism, right eye: Secondary | ICD-10-CM | POA: Diagnosis not present

## 2020-10-14 DIAGNOSIS — H2511 Age-related nuclear cataract, right eye: Secondary | ICD-10-CM | POA: Diagnosis not present

## 2020-10-28 DIAGNOSIS — Z08 Encounter for follow-up examination after completed treatment for malignant neoplasm: Secondary | ICD-10-CM | POA: Diagnosis not present

## 2020-11-04 DIAGNOSIS — H2512 Age-related nuclear cataract, left eye: Secondary | ICD-10-CM | POA: Diagnosis not present

## 2020-11-04 DIAGNOSIS — H52202 Unspecified astigmatism, left eye: Secondary | ICD-10-CM | POA: Diagnosis not present

## 2020-12-12 DIAGNOSIS — J309 Allergic rhinitis, unspecified: Secondary | ICD-10-CM | POA: Diagnosis not present

## 2020-12-12 DIAGNOSIS — R040 Epistaxis: Secondary | ICD-10-CM | POA: Diagnosis not present

## 2020-12-12 DIAGNOSIS — J339 Nasal polyp, unspecified: Secondary | ICD-10-CM | POA: Diagnosis not present

## 2020-12-12 DIAGNOSIS — Z6827 Body mass index (BMI) 27.0-27.9, adult: Secondary | ICD-10-CM | POA: Diagnosis not present

## 2020-12-26 DIAGNOSIS — J339 Nasal polyp, unspecified: Secondary | ICD-10-CM | POA: Diagnosis not present

## 2020-12-26 DIAGNOSIS — E663 Overweight: Secondary | ICD-10-CM | POA: Diagnosis not present

## 2020-12-26 DIAGNOSIS — I10 Essential (primary) hypertension: Secondary | ICD-10-CM | POA: Diagnosis not present

## 2020-12-26 DIAGNOSIS — Z0000 Encounter for general adult medical examination without abnormal findings: Secondary | ICD-10-CM | POA: Diagnosis not present

## 2020-12-26 DIAGNOSIS — Z9181 History of falling: Secondary | ICD-10-CM | POA: Diagnosis not present

## 2020-12-26 DIAGNOSIS — I2510 Atherosclerotic heart disease of native coronary artery without angina pectoris: Secondary | ICD-10-CM | POA: Diagnosis not present

## 2020-12-26 DIAGNOSIS — Z6827 Body mass index (BMI) 27.0-27.9, adult: Secondary | ICD-10-CM | POA: Diagnosis not present

## 2020-12-26 DIAGNOSIS — J309 Allergic rhinitis, unspecified: Secondary | ICD-10-CM | POA: Diagnosis not present

## 2020-12-26 DIAGNOSIS — Z87891 Personal history of nicotine dependence: Secondary | ICD-10-CM | POA: Diagnosis not present

## 2020-12-26 DIAGNOSIS — C675 Malignant neoplasm of bladder neck: Secondary | ICD-10-CM | POA: Diagnosis not present

## 2020-12-26 DIAGNOSIS — E785 Hyperlipidemia, unspecified: Secondary | ICD-10-CM | POA: Diagnosis not present

## 2021-04-09 DIAGNOSIS — Z461 Encounter for fitting and adjustment of hearing aid: Secondary | ICD-10-CM

## 2021-04-09 HISTORY — DX: Encounter for fitting and adjustment of hearing aid: Z46.1

## 2021-04-11 ENCOUNTER — Encounter: Payer: Self-pay | Admitting: Cardiology

## 2021-04-11 ENCOUNTER — Other Ambulatory Visit: Payer: Self-pay

## 2021-04-11 ENCOUNTER — Ambulatory Visit: Payer: PPO | Admitting: Cardiology

## 2021-04-11 VITALS — BP 130/64 | HR 65 | Ht 70.0 in | Wt 199.0 lb

## 2021-04-11 DIAGNOSIS — I2510 Atherosclerotic heart disease of native coronary artery without angina pectoris: Secondary | ICD-10-CM | POA: Diagnosis not present

## 2021-04-11 DIAGNOSIS — E782 Mixed hyperlipidemia: Secondary | ICD-10-CM | POA: Diagnosis not present

## 2021-04-11 DIAGNOSIS — I10 Essential (primary) hypertension: Secondary | ICD-10-CM | POA: Diagnosis not present

## 2021-04-11 NOTE — Patient Instructions (Addendum)
Medication Instructions:  Your physician recommends that you continue on your current medications as directed. Please refer to the Current Medication list given to you today.  *If you need a refill on your cardiac medications before your next appointment, please call your pharmacy*   Lab Work: Your physician recommends that you return for lab work in: In the next few days and while fasting:  CBC, BMP, TSH, Lipids, Liver If you have labs (blood work) drawn today and your tests are completely normal, you will receive your results only by: [place] (if you have MyChart) OR A paper copy in the mail If you have any lab test that is abnormal or we need to change your treatment, we will call you to review the results.   Testing/Procedures: None   Follow-Up: At [facility], you and your health needs are our priority.  As part of our continuing mission to provide you with exceptional heart care, we have created designated Provider Care Teams.  These Care Teams include your primary Cardiologist (physician) and Advanced Practice Providers (APPs -  Physician Assistants and Nurse Practitioners) who all work together to provide you with the care you need, when you need it.  We recommend signing up for the patient portal called "MyChart".  Sign up information is provided on this After Visit Summary.  MyChart is used to connect with patients for Virtual Visits (Telemedicine).  Patients are able to view lab/test results, encounter notes, upcoming appointments, etc.  Non-urgent messages can be sent to your provider as well.   To learn more about what you can do with MyChart, go to [email or web address].    Your next appointment:   6 month(s)  The format for your next appointment:   In Person  Provider:   Dr. Geraldo Pitter    Other Instructions

## 2021-04-11 NOTE — Progress Notes (Signed)
Cardiology Office Note:    Date:  04/11/2021   ID:  Louis Carrillo, DOB February 06, 1946, MRN [id number]  PCP:  Elenore Paddy, NP  Cardiologist:  Jenean Lindau, MD   Referring MD: Elenore Paddy, NP    ASSESSMENT:    1. Coronary artery disease involving native coronary artery of native heart without angina pectoris   2. Essential (primary) hypertension   3. Mixed hyperlipidemia    PLAN:    In order of problems listed above:  Coronary artery disease: Secondary prevention stressed with the patient.  Importance of compliance with diet medication stressed and she vocalized understanding.  He was advised to walk at least half an hour a day 5 days a week and he promises to do so. Essential hypertension: Blood pressure is stable and diet was emphasized.  Salt intake issues etc. were discussed. Mixed dyslipidemia: Lipids were reviewed from last visit.  I discussed this with him and he will come back in the next few days for complete blood work including fasting lipids. Ex-smoker: Promises never to go back again to smoking. Patient will be seen in follow-up appointment in 6 months or earlier if the patient has any concerns    Medication Adjustments/Labs and Tests Ordered: Current medicines are reviewed at length with the patient today.  Concerns regarding medicines are outlined above.  No orders of the defined types were placed in this encounter.  No orders of the defined types were placed in this encounter.    Chief Complaint  Patient presents with   Follow-up     History of Present Illness:    Louis Carrillo is a 75 y.o. male.  Patient has past medical history of coronary artery disease, essential hypertension and dyslipidemia.  He is ex-smoker and quit many years ago.  He denies any chest pain orthopnea or PND.  At the time of my evaluation, the patient is alert awake oriented and in no distress.  He walks on a regular basis and denies any symptoms with walking.  Past  Medical History:  Diagnosis Date   Abnormal nuclear stress test 11/09/2019   Benign hypertension 09/25/2020   Benign prostatic hyperplasia 09/25/2020   BPH without obstruction/lower urinary tract symptoms 04/10/2020   CAD (coronary artery disease) 12/19/2019   Cancer ([place]) 2014   bladder cancer    Chest tightness 10/10/2019   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   Coronary arteriosclerosis 09/25/2020   Encounter for fitting and adjustment of hearing aid 04/09/2021   Encounter for immunization 09/25/2020   Essential (primary) hypertension 10/10/2019   Ex-smoker 10/10/2019   Foot pain 09/25/2020   Headache 09/25/2020   Hearing loss 09/25/2020   History of bladder cancer 04/10/2020   Hyperlipidemia    Hypertrophy of prostate without urinary obstruction and other lower urinary tract symptoms (LUTS) 09/25/2020   Mixed dyslipidemia 11/09/2019   Peptic ulcer 09/25/2020   Tobacco use disorder 09/25/2020    Past Surgical History:  Procedure Laterality Date   BLADDER TUMOR EXCISION  2014   x3   COLONOSCOPY  12/24/2014   Gupta-polyps   INGUINAL HERNIA REPAIR  age 73   pt unsure what surgery   POLYPECTOMY     WOUND DEBRIDEMENT  1969    Current Medications: Current Meds  Medication Sig   losartan-hydrochlorothiazide (HYZAAR) 50-12.5 MG tablet Take 1 tablet by mouth daily.   nitroGLYCERIN (NITROSTAT) 0.4 MG SL tablet Place 0.4 mg under the tongue every 5 (five) minutes as needed  for chest pain.   Pitavastatin Calcium 2 MG TABS Take 2 mg by mouth daily.     Allergies:   Atorvastatin, Citalopram, Omeprazole, and Ranitidine   Social History   Socioeconomic History   Marital status: Married    Spouse name: Not on file   Number of children: Not on file   Years of education: Not on file   Highest education level: Not on file  Occupational History   Not on file  Tobacco Use   Smoking status: Former    Types: Cigarettes     Quit date: 07/27/1998    Years since quitting: 22.7   Smokeless tobacco: Never  Vaping Use   Vaping Use: Never used  Substance and Sexual Activity   Alcohol use: Yes    Alcohol/week: 15.0 standard drinks    Types: 15 Cans of beer per week   Drug use: No   Sexual activity: Not on file  Other Topics Concern   Not on file  Social History Narrative   Not on file   Social Determinants of Health   Financial Resource Strain: Not on file  Food Insecurity: Not on file  Transportation Needs: Not on file  Physical Activity: Not on file  Stress: Not on file  Social Connections: Not on file     Family History: The patient's family history includes Alcohol abuse in his paternal grandfather; Heart attack in his maternal grandmother; Hypertension in his brother, father, and mother. There is no history of Colon cancer, Colon polyps, Esophageal cancer, Rectal cancer, or Stomach cancer.  ROS:   Please see the history of present illness.    All other systems reviewed and are negative.  EKGs/Labs/Other Studies Reviewed:    The following studies were reviewed today: I discussed my findings with the patient at length.  EKG reveals sinus rhythm and nonspecific ST-T changes   Recent Labs: 06/24/2020: ALT 17; BUN 18; Creatinine, Ser 0.98; Potassium 4.6; Sodium 138  Recent Lipid Panel    Component Value Date/Time   CHOL 154 06/24/2020 0808   TRIG 50 06/24/2020 0808   HDL 55 06/24/2020 0808   CHOLHDL 2.8 06/24/2020 0808   LDLCALC 88 06/24/2020 0808    Physical Exam:    VS:  BP 130/64 (BP Location: Right Arm, Patient Position: Sitting, Cuff Size: Normal)   Pulse 65   Ht 5\' 10"  (1.778 m)   Wt 199 lb (90.3 kg)   SpO2 97%   BMI 28.55 kg/m     Wt Readings from Last 3 Encounters:  04/11/21 199 lb (90.3 kg)  09/27/20 193 lb 9.6 oz (87.8 kg)  05/24/20 198 lb (89.8 kg)     GEN: Patient is in no acute distress HEENT: Normal NECK: No JVD; No carotid bruits LYMPHATICS: No  lymphadenopathy CARDIAC: Hear sounds regular, 2/6 systolic murmur at the apex. RESPIRATORY:  Clear to auscultation without rales, wheezing or rhonchi  ABDOMEN: Soft, non-tender, non-distended MUSCULOSKELETAL:  No edema; No deformity  SKIN: Warm and dry NEUROLOGIC:  Alert and oriented x 3 PSYCHIATRIC:  Normal affect   Signed, Jenean Lindau, MD  04/11/2021 10:49 AM    [place]

## 2021-04-26 DIAGNOSIS — Z20828 Contact with and (suspected) exposure to other viral communicable diseases: Secondary | ICD-10-CM | POA: Diagnosis not present

## 2021-04-26 DIAGNOSIS — R509 Fever, unspecified: Secondary | ICD-10-CM | POA: Diagnosis not present

## 2021-04-26 DIAGNOSIS — J209 Acute bronchitis, unspecified: Secondary | ICD-10-CM | POA: Diagnosis not present

## 2021-05-16 DIAGNOSIS — I2510 Atherosclerotic heart disease of native coronary artery without angina pectoris: Secondary | ICD-10-CM | POA: Diagnosis not present

## 2021-05-16 DIAGNOSIS — I10 Essential (primary) hypertension: Secondary | ICD-10-CM | POA: Diagnosis not present

## 2021-05-16 DIAGNOSIS — E782 Mixed hyperlipidemia: Secondary | ICD-10-CM | POA: Diagnosis not present

## 2021-05-17 LAB — BASIC METABOLIC PANEL
BUN/Creatinine Ratio: 17 (ref 10–24)
BUN: 16 mg/dL (ref 8–27)
CO2: 26 mmol/L (ref 20–29)
Calcium: 9.3 mg/dL (ref 8.6–10.2)
Chloride: 104 mmol/L (ref 96–106)
Creatinine, Ser: 0.95 mg/dL (ref 0.76–1.27)
Glucose: 91 mg/dL (ref 70–99)
Potassium: 4.3 mmol/L (ref 3.5–5.2)
Sodium: 140 mmol/L (ref 134–144)
eGFR: 84 mL/min/{1.73_m2} (ref >59)

## 2021-05-17 LAB — HEPATIC FUNCTION PANEL
ALT: 19 IU/L (ref 0–44)
AST: 17 IU/L (ref 0–40)
Albumin: 4.1 g/dL (ref 3.7–4.7)
Alkaline Phosphatase: 77 IU/L (ref 44–121)
Bilirubin Total: 0.5 mg/dL (ref 0.0–1.2)
Bilirubin, Direct: 0.14 mg/dL (ref 0.00–0.40)
Total Protein: 6.3 g/dL (ref 6.0–8.5)

## 2021-05-17 LAB — CBC
Hematocrit: 40.5 % (ref 37.5–51.0)
Hemoglobin: 13.7 g/dL (ref 13.0–17.7)
MCH: 30 pg (ref 26.6–33.0)
MCHC: 33.8 g/dL (ref 31.5–35.7)
MCV: 89 fL (ref 79–97)
Platelets: 172 10*3/uL (ref 150–450)
RBC: 4.56 x10E6/uL (ref 4.14–5.80)
RDW: 12.8 % (ref 11.6–15.4)
WBC: 4 10*3/uL (ref 3.4–10.8)

## 2021-05-17 LAB — LIPID PANEL
Chol/HDL Ratio: 3.9 ratio (ref 0.0–5.0)
Cholesterol, Total: 164 mg/dL (ref 100–199)
HDL: 42 mg/dL (ref >39)
LDL Chol Calc (NIH): 106 mg/dL — ABNORMAL HIGH (ref 0–99)
Triglycerides: 87 mg/dL (ref 0–149)
VLDL Cholesterol Cal: 16 mg/dL (ref 5–40)

## 2021-05-17 LAB — TSH: TSH: 1.73 u[IU]/mL (ref 0.450–4.500)

## 2021-05-19 ENCOUNTER — Telehealth: Payer: Self-pay

## 2021-05-19 DIAGNOSIS — E782 Mixed hyperlipidemia: Secondary | ICD-10-CM

## 2021-05-19 MED ORDER — EZETIMIBE 10 MG PO TABS: 10.0000 mg | ORAL_TABLET | Freq: Every day | ORAL | 3 refills | Status: DC

## 2021-05-19 NOTE — Addendum Note (Signed)
Addended by: Resa Miner I on: 05/19/2021 01:06 PM   Modules accepted: Orders

## 2021-05-19 NOTE — Telephone Encounter (Signed)
Spoke with patient regarding results and recommendation.  Patient verbalizes understanding and is agreeable to plan of care. Advised patient to call back with any issues or concerns.  

## 2021-05-19 NOTE — Telephone Encounter (Signed)
-----   Message from Jenean Lindau, MD sent at 05/19/2021 11:10 AM EST ----- Double statin, diet and liver lipid check in 6 weeks.  Copy primary doctor. Jenean Lindau, MD 05/19/2021 11:09 AM

## 2021-05-23 DIAGNOSIS — R040 Epistaxis: Secondary | ICD-10-CM | POA: Diagnosis not present

## 2021-05-23 DIAGNOSIS — J342 Deviated nasal septum: Secondary | ICD-10-CM | POA: Diagnosis not present

## 2021-05-23 DIAGNOSIS — I10 Essential (primary) hypertension: Secondary | ICD-10-CM | POA: Diagnosis not present

## 2021-06-27 ENCOUNTER — Other Ambulatory Visit: Payer: Self-pay

## 2021-06-27 DIAGNOSIS — E782 Mixed hyperlipidemia: Secondary | ICD-10-CM | POA: Diagnosis not present

## 2021-06-27 DIAGNOSIS — H10021 Other mucopurulent conjunctivitis, right eye: Secondary | ICD-10-CM | POA: Diagnosis not present

## 2021-06-27 DIAGNOSIS — H10022 Other mucopurulent conjunctivitis, left eye: Secondary | ICD-10-CM | POA: Diagnosis not present

## 2021-06-28 LAB — HEPATIC FUNCTION PANEL
ALT: 18 IU/L (ref 0–44)
AST: 18 IU/L (ref 0–40)
Albumin: 4.1 g/dL (ref 3.7–4.7)
Alkaline Phosphatase: 73 IU/L (ref 44–121)
Bilirubin Total: 0.6 mg/dL (ref 0.0–1.2)
Bilirubin, Direct: 0.18 mg/dL (ref 0.00–0.40)
Total Protein: 6.4 g/dL (ref 6.0–8.5)

## 2021-06-28 LAB — LIPID PANEL
Chol/HDL Ratio: 3.4 ratio (ref 0.0–5.0)
Cholesterol, Total: 178 mg/dL (ref 100–199)
HDL: 53 mg/dL (ref >39)
LDL Chol Calc (NIH): 111 mg/dL — ABNORMAL HIGH (ref 0–99)
Triglycerides: 75 mg/dL (ref 0–149)
VLDL Cholesterol Cal: 14 mg/dL (ref 5–40)

## 2021-06-30 ENCOUNTER — Telehealth: Payer: Self-pay

## 2021-06-30 DIAGNOSIS — E782 Mixed hyperlipidemia: Secondary | ICD-10-CM

## 2021-06-30 NOTE — Telephone Encounter (Signed)
-----   Message from Jenean Lindau, MD sent at 06/30/2021  9:48 AM EST ----- Lipids not to goal. Can he double statin diet and rpt LL in 6wks... cc pcp Jenean Lindau, MD 06/30/2021 9:48 AM

## 2021-06-30 NOTE — Telephone Encounter (Signed)
Spoke with patient regarding results and recommendation.  Patient verbalizes understanding and is agreeable to plan of care. Advised patient to call back with any issues or concerns.  

## 2021-07-08 DIAGNOSIS — R509 Fever, unspecified: Secondary | ICD-10-CM | POA: Diagnosis not present

## 2021-07-08 DIAGNOSIS — R051 Acute cough: Secondary | ICD-10-CM | POA: Diagnosis not present

## 2021-07-08 DIAGNOSIS — R0981 Nasal congestion: Secondary | ICD-10-CM | POA: Diagnosis not present

## 2021-07-08 DIAGNOSIS — J209 Acute bronchitis, unspecified: Secondary | ICD-10-CM | POA: Diagnosis not present

## 2021-07-09 DIAGNOSIS — Z6828 Body mass index (BMI) 28.0-28.9, adult: Secondary | ICD-10-CM | POA: Diagnosis not present

## 2021-07-09 DIAGNOSIS — J40 Bronchitis, not specified as acute or chronic: Secondary | ICD-10-CM | POA: Diagnosis not present

## 2021-07-09 DIAGNOSIS — J309 Allergic rhinitis, unspecified: Secondary | ICD-10-CM | POA: Diagnosis not present

## 2021-08-14 DIAGNOSIS — L821 Other seborrheic keratosis: Secondary | ICD-10-CM | POA: Diagnosis not present

## 2021-08-14 DIAGNOSIS — Z85828 Personal history of other malignant neoplasm of skin: Secondary | ICD-10-CM | POA: Diagnosis not present

## 2021-08-14 DIAGNOSIS — D225 Melanocytic nevi of trunk: Secondary | ICD-10-CM | POA: Diagnosis not present

## 2021-08-14 DIAGNOSIS — C44629 Squamous cell carcinoma of skin of left upper limb, including shoulder: Secondary | ICD-10-CM | POA: Diagnosis not present

## 2021-08-14 DIAGNOSIS — L570 Actinic keratosis: Secondary | ICD-10-CM | POA: Diagnosis not present

## 2021-08-14 DIAGNOSIS — D485 Neoplasm of uncertain behavior of skin: Secondary | ICD-10-CM | POA: Diagnosis not present

## 2021-09-05 DIAGNOSIS — R040 Epistaxis: Secondary | ICD-10-CM | POA: Diagnosis not present

## 2021-09-05 DIAGNOSIS — J302 Other seasonal allergic rhinitis: Secondary | ICD-10-CM | POA: Diagnosis not present

## 2021-09-05 DIAGNOSIS — J310 Chronic rhinitis: Secondary | ICD-10-CM | POA: Diagnosis not present

## 2021-09-05 DIAGNOSIS — I10 Essential (primary) hypertension: Secondary | ICD-10-CM | POA: Diagnosis not present

## 2021-09-05 DIAGNOSIS — J342 Deviated nasal septum: Secondary | ICD-10-CM | POA: Diagnosis not present

## 2021-09-12 ENCOUNTER — Other Ambulatory Visit: Payer: Self-pay

## 2021-09-12 DIAGNOSIS — E782 Mixed hyperlipidemia: Secondary | ICD-10-CM

## 2021-09-12 DIAGNOSIS — I2510 Atherosclerotic heart disease of native coronary artery without angina pectoris: Secondary | ICD-10-CM

## 2021-09-13 LAB — HEPATIC FUNCTION PANEL
ALT: 18 IU/L (ref 0–44)
AST: 15 IU/L (ref 0–40)
Albumin: 4 g/dL (ref 3.7–4.7)
Alkaline Phosphatase: 78 IU/L (ref 44–121)
Bilirubin Total: 0.5 mg/dL (ref 0.0–1.2)
Bilirubin, Direct: 0.16 mg/dL (ref 0.00–0.40)
Total Protein: 6.5 g/dL (ref 6.0–8.5)

## 2021-09-13 LAB — LIPID PANEL
Chol/HDL Ratio: 3.9 ratio (ref 0.0–5.0)
Cholesterol, Total: 184 mg/dL (ref 100–199)
HDL: 47 mg/dL (ref >39)
LDL Chol Calc (NIH): 121 mg/dL — ABNORMAL HIGH (ref 0–99)
Triglycerides: 89 mg/dL (ref 0–149)
VLDL Cholesterol Cal: 16 mg/dL (ref 5–40)

## 2021-09-16 MED ORDER — NEXLIZET 180-10 MG PO TABS: 1.0000 | ORAL_TABLET | Freq: Every day | ORAL | 12 refills | Status: DC

## 2021-09-16 MED ORDER — PITAVASTATIN CALCIUM 2 MG PO TABS: 1.0000 | ORAL_TABLET | Freq: Every day | ORAL | 12 refills | Status: DC

## 2021-09-18 ENCOUNTER — Telehealth: Payer: Self-pay

## 2021-09-18 DIAGNOSIS — I2510 Atherosclerotic heart disease of native coronary artery without angina pectoris: Secondary | ICD-10-CM

## 2021-09-18 DIAGNOSIS — E782 Mixed hyperlipidemia: Secondary | ICD-10-CM

## 2021-09-18 MED ORDER — NEXLIZET 180-10 MG PO TABS: 1.0000 | ORAL_TABLET | Freq: Every day | ORAL | 12 refills | Status: DC

## 2021-09-18 MED ORDER — PITAVASTATIN CALCIUM 2 MG PO TABS: 1.0000 | ORAL_TABLET | Freq: Every day | ORAL | 12 refills | Status: DC

## 2021-09-18 NOTE — Telephone Encounter (Signed)
PA approved for Livalo 2 mg from 09/18/21 to 05/10/22. ?

## 2021-09-18 NOTE — Telephone Encounter (Signed)
PA for Nexlizet 180-10 mg approved from 09/18/21 until 03/21/22. ?

## 2021-09-18 NOTE — Telephone Encounter (Signed)
Key: [id number] PA for Livalo started ?Key: [id number] PA for Nexlizet started  ?

## 2021-10-13 ENCOUNTER — Other Ambulatory Visit: Payer: Self-pay

## 2021-10-14 ENCOUNTER — Encounter: Payer: Self-pay | Admitting: Cardiology

## 2021-10-14 ENCOUNTER — Ambulatory Visit: Payer: PPO | Admitting: Cardiology

## 2021-10-14 VITALS — BP 130/70 | HR 70 | Ht 70.0 in | Wt 195.0 lb

## 2021-10-14 DIAGNOSIS — F17200 Nicotine dependence, unspecified, uncomplicated: Secondary | ICD-10-CM | POA: Diagnosis not present

## 2021-10-14 DIAGNOSIS — I2510 Atherosclerotic heart disease of native coronary artery without angina pectoris: Secondary | ICD-10-CM | POA: Diagnosis not present

## 2021-10-14 DIAGNOSIS — E782 Mixed hyperlipidemia: Secondary | ICD-10-CM

## 2021-10-14 DIAGNOSIS — I10 Essential (primary) hypertension: Secondary | ICD-10-CM

## 2021-10-14 NOTE — Progress Notes (Signed)
` Cardiology Office Note:    Date:  10/14/2021   ID:  Louis Carrillo, DOB 19-Jun-1945, MRN [id number]  PCP:  Elenore Paddy, NP  Cardiologist:  Jenean Lindau, MD   Referring MD: Elenore Paddy, NP    ASSESSMENT:    1. Coronary artery disease involving native coronary artery of native heart without angina pectoris   2. Essential (primary) hypertension   3. Tobacco use disorder   4. Mixed dyslipidemia    PLAN:    In order of problems listed above:  Coronary artery disease: Secondary prevention stressed with the patient.  Importance of compliance with diet medication stressed any vocalized understanding.  He was advised to walk at least half an hour a day 5 days a week and he promises to do so. Essential hypertension: Blood pressure stable and diet was emphasized.  Lifestyle modification urged. Mixed dyslipidemia: Lipids were reviewed and are still elevated and I counseled this about him extensively.  He promises to do better with diet and exercise with Ex-smoker: Promises never to go back to smoking. Patient will be seen in follow-up appointment in 6 months or earlier if the patient has any concerns    Medication Adjustments/Labs and Tests Ordered: Current medicines are reviewed at length with the patient today.  Concerns regarding medicines are outlined above.  No orders of the defined types were placed in this encounter.  No orders of the defined types were placed in this encounter.    Chief Complaint  Patient presents with   Follow-up     History of Present Illness:    Louis Carrillo is a 76 y.o. male.  Patient has past medical history of coronary artery disease, essential hypertension, mixed dyslipidemia and history of smoking in the past.  He denies any problems at this time and takes care of activities of daily living.  No chest pain orthopnea or PND.  At the time of my evaluation, the patient is alert awake oriented and in no distress.  Past Medical History:   Diagnosis Date   Abnormal nuclear stress test 11/09/2019   Benign hypertension 09/25/2020   Benign prostatic hyperplasia 09/25/2020   BPH without obstruction/lower urinary tract symptoms 04/10/2020   CAD (coronary artery disease) 12/19/2019   Cancer ([place]) 2014   bladder cancer    Chest tightness 10/10/2019   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   Coronary arteriosclerosis 09/25/2020   Encounter for fitting and adjustment of hearing aid 04/09/2021   Encounter for immunization 09/25/2020   Essential (primary) hypertension 10/10/2019   Ex-smoker 10/10/2019   Foot pain 09/25/2020   Headache 09/25/2020   Hearing loss 09/25/2020   History of bladder cancer 04/10/2020   Hyperlipidemia    Hypertrophy of prostate without urinary obstruction and other lower urinary tract symptoms (LUTS) 09/25/2020   Mixed dyslipidemia 11/09/2019   Peptic ulcer 09/25/2020   Tobacco use disorder 09/25/2020    Past Surgical History:  Procedure Laterality Date   BLADDER TUMOR EXCISION  2014   x3   COLONOSCOPY  12/24/2014   Gupta-polyps   INGUINAL HERNIA REPAIR  age 75   pt unsure what surgery   POLYPECTOMY     WOUND DEBRIDEMENT  1969    Current Medications: Current Meds  Medication Sig   Bempedoic Acid-Ezetimibe (NEXLIZET) 180-10 MG TABS Take 1 tablet by mouth daily.   losartan-hydrochlorothiazide (HYZAAR) 50-12.5 MG tablet Take 1 tablet by mouth daily.   nitroGLYCERIN (NITROSTAT) 0.4 MG SL tablet Place 0.4  mg under the tongue every 5 (five) minutes as needed for chest pain.   Pitavastatin Calcium 2 MG TABS Take 1 tablet (2 mg total) by mouth daily.     Allergies:   Atorvastatin, Citalopram, Omeprazole, and Ranitidine   Social History   Socioeconomic History   Marital status: Married    Spouse name: Not on file   Number of children: Not on file   Years of education: Not on file   Highest education level: Not on file  Occupational  History   Not on file  Tobacco Use   Smoking status: Former    Types: Cigarettes    Quit date: 07/27/1998    Years since quitting: 23.2   Smokeless tobacco: Never  Vaping Use   Vaping Use: Never used  Substance and Sexual Activity   Alcohol use: Yes    Alcohol/week: 15.0 standard drinks    Types: 15 Cans of beer per week   Drug use: No   Sexual activity: Not on file  Other Topics Concern   Not on file  Social History Narrative   Not on file   Social Determinants of Health   Financial Resource Strain: Not on file  Food Insecurity: Not on file  Transportation Needs: Not on file  Physical Activity: Not on file  Stress: Not on file  Social Connections: Not on file     Family History: The patient's family history includes Alcohol abuse in his paternal grandfather; Heart attack in his maternal grandmother; Hypertension in his brother, father, and mother. There is no history of Colon cancer, Colon polyps, Esophageal cancer, Rectal cancer, or Stomach cancer.  ROS:   Please see the history of present illness.    All other systems reviewed and are negative.  EKGs/Labs/Other Studies Reviewed:    The following studies were reviewed today: I discussed my findings with the patient at length.   Recent Labs: 05/16/2021: BUN 16; Creatinine, Ser 0.95; Hemoglobin 13.7; Platelets 172; Potassium 4.3; Sodium 140; TSH 1.730 09/12/2021: ALT 18  Recent Lipid Panel    Component Value Date/Time   CHOL 184 09/12/2021 0830   TRIG 89 09/12/2021 0830   HDL 47 09/12/2021 0830   CHOLHDL 3.9 09/12/2021 0830   LDLCALC 121 (H) 09/12/2021 0830    Physical Exam:    VS:  BP 130/70 (BP Location: Left Arm, Patient Position: Sitting, Cuff Size: Normal)   Pulse 70   Ht '5\' 10"'$  (1.778 m)   Wt 195 lb (88.5 kg)   SpO2 97%   BMI 27.98 kg/m     Wt Readings from Last 3 Encounters:  10/14/21 195 lb (88.5 kg)  04/11/21 199 lb (90.3 kg)  09/27/20 193 lb 9.6 oz (87.8 kg)     GEN: Patient is in no acute  distress HEENT: Normal NECK: No JVD; No carotid bruits LYMPHATICS: No lymphadenopathy CARDIAC: Hear sounds regular, 2/6 systolic murmur at the apex. RESPIRATORY:  Clear to auscultation without rales, wheezing or rhonchi  ABDOMEN: Soft, non-tender, non-distended MUSCULOSKELETAL:  No edema; No deformity  SKIN: Warm and dry NEUROLOGIC:  Alert and oriented x 3 PSYCHIATRIC:  Normal affect   Signed, Jenean Lindau, MD  10/14/2021 8:37 AM    [place]

## 2021-10-14 NOTE — Patient Instructions (Addendum)
Medication Instructions:  Your physician recommends that you continue on your current medications as directed. Please refer to the Current Medication list given to you today.  *If you need a refill on your cardiac medications before your next appointment, please call your pharmacy*   Lab Work: Your physician recommends that you return for lab work in: 3 months  You need to have labs done when you are fasting.  You can come Monday through Friday 8:30 am to 12:00 pm and 1:15 to 4:30. You do not need to make an appointment as the order has already been placed. The labs you are going to have done are BMET,  LFT and Lipids.  If you have labs (blood work) drawn today and your tests are completely normal, you will receive your results only by: [place] (if you have MyChart) OR A paper copy in the mail If you have any lab test that is abnormal or we need to change your treatment, we will call you to review the results.   Testing/Procedures: None ordered   Follow-Up: At [facility], you and your health needs are our priority.  As part of our continuing mission to provide you with exceptional heart care, we have created designated Provider Care Teams.  These Care Teams include your primary Cardiologist (physician) and Advanced Practice Providers (APPs -  Physician Assistants and Nurse Practitioners) who all work together to provide you with the care you need, when you need it.  We recommend signing up for the patient portal called "MyChart".  Sign up information is provided on this After Visit Summary.  MyChart is used to connect with patients for Virtual Visits (Telemedicine).  Patients are able to view lab/test results, encounter notes, upcoming appointments, etc.  Non-urgent messages can be sent to your provider as well.   To learn more about what you can do with MyChart, go to [email or web address].    Your next appointment:   6 month(s)  The format for your next appointment:   In  Person  Provider:   Jyl Heinz, MD   Other Instructions Bempedoic Acid; Ezetimibe Tablets What is this medication? BEMPEDOIC ACID; EZETIMIBE (BEM pe DOE ik AS id; ez ET i mibe) treats high cholesterol. It works by reducing the amount of cholesterol made by your body. It also reduces the amount of cholesterol absorbed from the food you eat. This decreases the amount of bad cholesterol (such as LDL) in your blood. Changes to diet and exercise are often combined with this medication. This medicine may be used for other purposes; ask your health care provider or pharmacist if you have questions. COMMON BRAND NAME(S): NEXLIZET What should I tell my care team before I take this medication? They need to know if you have any of these conditions: Gout Kidney problems Liver disease Tendon problems An unusual or allergic reaction to bempedoic acid, ezetimibe, other medications, foods, dyes, or preservatives Pregnant or trying to become pregnant Breast-feeding How should I use this medication? Take this medication by mouth. Take it as directed on the prescription label at the same time every day. Do not cut, crush, or chew this medication. Swallow the tablets whole. You can take it with or without food. If it upsets your stomach, take it with food. Keep taking it unless your care team tells you to stop. Take this medication at least 2 hours BEFORE or at least 4 hours AFTER administration of a bile acid sequestrant. Talk to your care team about the use of  this medication in children. Special care may be needed. Take this medication by mouth. Take it as directed on the prescription label at the same time every day. Do not cut, crush or chew this medication. Swallow the tablets whole. You can take it with or without food. If it upsets your stomach, take it with food. Keep taking it unless your care team tells you to stop.Take this medication at least 2 hours BEFORE or at least 4 hours AFTER  administration of a bile acid sequestrant.Talk to your care team about the use of this medication in children. Special care may be needed. Overdosage: If you think you have taken too much of this medicine contact a poison control center or emergency room at once. NOTE: This medicine is only for you. Do not share this medicine with others. What if I miss a dose? If you miss a dose, take it as soon as you can. If it is almost time for your next dose, take only that dose. Do not take double or extra doses. What may interact with this medication? Do not take this medication with any of the following: Fenofibrate Gemfibrozil This medication may also interact with the following: Antacids Cyclosporine Other medications to lower cholesterol or triglycerides Pravastatin Simvastatin This list may not describe all possible interactions. Give your health care provider a list of all the medicines, herbs, non-prescription drugs, or dietary supplements you use. Also tell them if you smoke, drink alcohol, or use illegal drugs. Some items may interact with your medicine. What should I watch for while using this medication? Visit your care team for regular checks on your progress. Tell your care team if your symptoms do not start to get better or if they get worse. Your care team may tell you to stop taking this medication if you develop muscle problems. If your muscle problems do not go away after stopping this medication, contact your care team. Taking this medication is only part of a total heart healthy program. Ask your care team if there are other changes you can make to improve your overall health. What side effects may I notice from receiving this medication? Side effects that you should report to your care team as soon as possible: Allergic reactions--skin rash, itching, hives, swelling of the face, lips, tongue, or throat High uric acid level--severe pain, redness, warmth, or swelling in joints, pain  or trouble passing urine, pain in the lower back or sides Joint, muscle, or tendon pain, swelling, or stiffness Side effects that usually do not require medical attention (report to your care team if they continue or are bothersome): Diarrhea Fatigue Muscle spasms Stomach pain This list may not describe all possible side effects. Call your doctor for medical advice about side effects. You may report side effects to FDA at 1-800-FDA-1088. Where should I keep my medication? Keep out of the reach of children and pets. Store between 15 and 30 degrees C (59 and 86 degrees F). Keep this medication in the original container. Protect from moisture. Keep the container tightly closed. Do not throw out the packet in the container. It keeps the medication dry. Avoid exposure to extreme heat. Get rid of any unused medication after the expiration date. To get rid of medications that are no longer needed or have expired: Take the medication to a medication take-back program. Check with your pharmacy or law enforcement to find a location. If you cannot return the medication, check the label or package insert to see if  the medication should be thrown out in the trash or flushed down the toilet. If you are not sure, ask your care team. If it is safe to put it in the trash, empty the medication out of the container. Mix the medication with cat litter, dirt, coffee grounds, or other unwanted substance. Seal the mixture in a bag or container. Put it in the trash. NOTE: This sheet is a summary. It may not cover all possible information. If you have questions about this medicine, talk to your doctor, pharmacist, or health care provider.  2023 Elsevier/Gold Standard (2021-06-06 00:00:00)

## 2021-10-15 DIAGNOSIS — R051 Acute cough: Secondary | ICD-10-CM | POA: Diagnosis not present

## 2021-10-15 DIAGNOSIS — J3489 Other specified disorders of nose and nasal sinuses: Secondary | ICD-10-CM | POA: Diagnosis not present

## 2021-10-15 DIAGNOSIS — J069 Acute upper respiratory infection, unspecified: Secondary | ICD-10-CM | POA: Diagnosis not present

## 2021-12-16 DIAGNOSIS — H26493 Other secondary cataract, bilateral: Secondary | ICD-10-CM | POA: Diagnosis not present

## 2021-12-16 DIAGNOSIS — H02834 Dermatochalasis of left upper eyelid: Secondary | ICD-10-CM | POA: Diagnosis not present

## 2021-12-16 DIAGNOSIS — H02831 Dermatochalasis of right upper eyelid: Secondary | ICD-10-CM | POA: Diagnosis not present

## 2022-02-05 DIAGNOSIS — R051 Acute cough: Secondary | ICD-10-CM | POA: Diagnosis not present

## 2022-02-05 DIAGNOSIS — R519 Headache, unspecified: Secondary | ICD-10-CM | POA: Diagnosis not present

## 2022-02-05 DIAGNOSIS — R070 Pain in throat: Secondary | ICD-10-CM | POA: Diagnosis not present

## 2022-02-11 DIAGNOSIS — H02834 Dermatochalasis of left upper eyelid: Secondary | ICD-10-CM | POA: Diagnosis not present

## 2022-02-11 DIAGNOSIS — H02831 Dermatochalasis of right upper eyelid: Secondary | ICD-10-CM | POA: Diagnosis not present

## 2022-04-06 ENCOUNTER — Telehealth: Payer: Self-pay | Admitting: Cardiology

## 2022-04-06 MED ORDER — NITROGLYCERIN 0.4 MG SL SUBL: 0.4000 mg | SUBLINGUAL_TABLET | SUBLINGUAL | 6 refills | Status: DC | PRN

## 2022-04-06 NOTE — Telephone Encounter (Signed)
Refill of Nitroglycerin sent to [facility].

## 2022-04-06 NOTE — Telephone Encounter (Signed)
Needs refill on Nitro- his are expired  Best number [phone]  Preferred pharmacy [facility] on [address]

## 2022-04-13 ENCOUNTER — Telehealth: Payer: Self-pay

## 2022-04-13 NOTE — Telephone Encounter (Signed)
PA submitted for Nexlizet 180-10 mg on CMM. Key [id number]

## 2022-04-14 NOTE — Telephone Encounter (Signed)
Lokish from [employer] calling because he would like to have most recent LDL level other than 09/12/21.

## 2022-04-14 NOTE — Telephone Encounter (Signed)
Left message that no recent LDL is available.

## 2022-04-14 NOTE — Telephone Encounter (Signed)
PA approved for Nexlizet 180 mg-10 mg from 04/14/22 until 04/15/23.

## 2022-04-23 ENCOUNTER — Encounter: Payer: Self-pay | Admitting: Cardiology

## 2022-04-23 ENCOUNTER — Ambulatory Visit: Payer: PPO | Attending: Cardiology | Admitting: Cardiology

## 2022-04-23 VITALS — BP 122/62 | HR 82 | Ht 70.0 in | Wt 193.4 lb

## 2022-04-23 DIAGNOSIS — I2510 Atherosclerotic heart disease of native coronary artery without angina pectoris: Secondary | ICD-10-CM

## 2022-04-23 DIAGNOSIS — E782 Mixed hyperlipidemia: Secondary | ICD-10-CM

## 2022-04-23 DIAGNOSIS — I10 Essential (primary) hypertension: Secondary | ICD-10-CM

## 2022-04-23 NOTE — Progress Notes (Signed)
Cardiology Office Note:    Date:  04/23/2022   ID:  Louis Carrillo, DOB December 02, 1945, MRN [id number]  PCP:  Elenore Paddy, NP  Cardiologist:  Jenean Lindau, MD   Referring MD: Elenore Paddy, NP    ASSESSMENT:    1. Coronary artery disease involving native coronary artery of native heart without angina pectoris   2. Essential (primary) hypertension   3. Mixed dyslipidemia    PLAN:    In order of problems listed above:  Coronary artery disease: Secondary prevention stressed with the patient.  Importance of compliance with diet medication stressed and vocalized understanding.  He was advised to walk at least half an hour a day 5 days a week and he promises to do so. Essential hypertension: Blood pressure stable and diet was emphasized. Mixed dyslipidemia: On lipid-lowering medications followed by primary care.  I told him to come back in the next few days for complete blood work.  Will do lipid check also.  I will also do hemoglobin A1c and vitamin D per his request.  He has had history of deficiency. Patient will be seen in follow-up appointment in 6 months or earlier if the patient has any concerns    Medication Adjustments/Labs and Tests Ordered: Current medicines are reviewed at length with the patient today.  Concerns regarding medicines are outlined above.  No orders of the defined types were placed in this encounter.  No orders of the defined types were placed in this encounter.    No chief complaint on file.    History of Present Illness:    Louis Carrillo is a 75 y.o. male.  Patient has past medical history of coronary artery disease, essential hypertension and dyslipidemia.  He denies any problems at this time and takes care of activities of daily living.  No chest pain orthopnea or PND.  At the time of my evaluation, the patient is alert awake oriented and in no distress.  He does not exercise on a regular basis.  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   Coronary arteriosclerosis 09/25/2020   Encounter for fitting and adjustment of hearing aid 04/09/2021   Encounter for immunization 09/25/2020   Essential (primary) hypertension 10/10/2019   Ex-smoker 10/10/2019   Foot pain 09/25/2020   Headache 09/25/2020   Hearing loss 09/25/2020   History of bladder cancer 04/10/2020   Hyperlipidemia    Hypertrophy of prostate without urinary obstruction and other lower urinary tract symptoms (LUTS) 09/25/2020   Mixed dyslipidemia 11/09/2019   Peptic ulcer 09/25/2020   Tobacco use disorder 09/25/2020    Past Surgical History:  Procedure Laterality Date   BLADDER TUMOR EXCISION  2014   x3   COLONOSCOPY  12/24/2014   Gupta-polyps   INGUINAL HERNIA REPAIR  age 84   pt unsure what surgery   POLYPECTOMY     WOUND DEBRIDEMENT  1969    Current Medications: Current Meds  Medication Sig   Bempedoic Acid-Ezetimibe (NEXLIZET) 180-10 MG TABS Take 1 tablet by mouth daily.   losartan-hydrochlorothiazide (HYZAAR) 50-12.5 MG tablet Take 1 tablet by mouth daily.   nitroGLYCERIN (NITROSTAT) 0.4 MG SL tablet  Place 1 tablet (0.4 mg total) under the tongue every 5 (five) minutes as needed for chest pain.     Allergies:   Atorvastatin, Citalopram, Omeprazole, and Ranitidine   Social History   Socioeconomic History   Marital status: Married    Spouse name: Not on file   Number of children: Not on file   Years of education: Not on file   Highest education level: Not on file  Occupational History   Not on file  Tobacco Use   Smoking status: Former    Types:  Cigarettes    Quit date: 07/27/1998    Years since quitting: 23.7   Smokeless tobacco: Never  Vaping Use   Vaping Use: Never used  Substance and Sexual Activity   Alcohol use: Yes    Alcohol/week: 15.0 standard drinks of alcohol    Types: 15 Cans of beer per week   Drug use: No   Sexual activity: Not on file  Other Topics Concern   Not on file  Social History Narrative   Not on file   Social Determinants of Health   Financial Resource Strain: Not on file  Food Insecurity: Not on file  Transportation Needs: Not on file  Physical Activity: Not on file  Stress: Not on file  Social Connections: Not on file     Family History: The patient's family history includes Alcohol abuse in his paternal grandfather; Heart attack in his maternal grandmother; Hypertension in his brother, father, and mother. There is no history of Colon cancer, Colon polyps, Esophageal cancer, Rectal cancer, or Stomach cancer.  ROS:   Please see the history of present illness.    All other systems reviewed and are negative.  EKGs/Labs/Other Studies Reviewed:    The following studies were reviewed today: I discussed my findings with the patient at length.   Recent Labs: 05/16/2021: BUN 16; Creatinine, Ser 0.95; Hemoglobin 13.7; Platelets 172; Potassium 4.3; Sodium 140; TSH 1.730 09/12/2021: ALT 18  Recent Lipid Panel    Component Value Date/Time   CHOL 184 09/12/2021 0830   TRIG 89 09/12/2021 0830   HDL 47 09/12/2021 0830   CHOLHDL 3.9 09/12/2021 0830   LDLCALC 121 (H) 09/12/2021 0830    Physical Exam:    VS:  BP 122/62   Pulse 82   Ht '5\' 10"'$  (1.778 m)   Wt 193 lb 6.4 oz (87.7 kg)   SpO2 96%   BMI 27.75 kg/m     Wt Readings from Last 3 Encounters:  04/23/22 193 lb 6.4 oz (87.7 kg)  10/14/21 195 lb (88.5 kg)  04/11/21 199 lb (90.3 kg)     GEN: Patient is in no acute distress HEENT: Normal NECK: No JVD; No carotid bruits LYMPHATICS: No lymphadenopathy CARDIAC: Hear sounds regular, 2/6  systolic murmur at the apex. RESPIRATORY:  Clear to auscultation without rales, wheezing or rhonchi  ABDOMEN: Soft, non-tender, non-distended MUSCULOSKELETAL:  No edema; No deformity  SKIN: Warm and dry NEUROLOGIC:  Alert and oriented x 3 PSYCHIATRIC:  Normal affect   Signed, Jenean Lindau, MD  04/23/2022 2:17 PM    [facility]

## 2022-04-23 NOTE — Patient Instructions (Signed)
Medication Instructions:  Your physician recommends that you continue on your current medications as directed. Please refer to the Current Medication list given to you today.  *If you need a refill on your cardiac medications before your next appointment, please call your pharmacy*   Lab Work: Your physician recommends that you have labs done in the office today. Your test included  basic metabolic panel, complete blood count, TSH, liver function and lipids.  If you have labs (blood work) drawn today and your tests are completely normal, you will receive your results only by: [place] (if you have MyChart) OR A paper copy in the mail If you have any lab test that is abnormal or we need to change your treatment, we will call you to review the results.   Testing/Procedures: None ordered   Follow-Up: At [facility], you and your health needs are our priority.  As part of our continuing mission to provide you with exceptional heart care, we have created designated Provider Care Teams.  These Care Teams include your primary Cardiologist (physician) and Advanced Practice Providers (APPs -  Physician Assistants and Nurse Practitioners) who all work together to provide you with the care you need, when you need it.  We recommend signing up for the patient portal called "MyChart".  Sign up information is provided on this After Visit Summary.  MyChart is used to connect with patients for Virtual Visits (Telemedicine).  Patients are able to view lab/test results, encounter notes, upcoming appointments, etc.  Non-urgent messages can be sent to your provider as well.   To learn more about what you can do with MyChart, go to [email or web address].    Your next appointment:   9 month(s)  The format for your next appointment:   In Person  Provider:   Jyl Heinz, MD    Other Instructions none  Important Information About Sugar

## 2022-04-24 DIAGNOSIS — I10 Essential (primary) hypertension: Secondary | ICD-10-CM | POA: Diagnosis not present

## 2022-04-24 DIAGNOSIS — I2510 Atherosclerotic heart disease of native coronary artery without angina pectoris: Secondary | ICD-10-CM | POA: Diagnosis not present

## 2022-04-24 DIAGNOSIS — E782 Mixed hyperlipidemia: Secondary | ICD-10-CM | POA: Diagnosis not present

## 2022-04-25 LAB — LIPID PANEL
Chol/HDL Ratio: 3.4 ratio (ref 0.0–5.0)
Cholesterol, Total: 148 mg/dL (ref 100–199)
HDL: 44 mg/dL (ref >39)
LDL Chol Calc (NIH): 84 mg/dL (ref 0–99)
Triglycerides: 108 mg/dL (ref 0–149)
VLDL Cholesterol Cal: 20 mg/dL (ref 5–40)

## 2022-04-25 LAB — COMPREHENSIVE METABOLIC PANEL
ALT: 20 IU/L (ref 0–44)
AST: 19 IU/L (ref 0–40)
Albumin/Globulin Ratio: 2.1 (ref 1.2–2.2)
Albumin: 4.4 g/dL (ref 3.8–4.8)
Alkaline Phosphatase: 51 IU/L (ref 44–121)
BUN/Creatinine Ratio: 16 (ref 10–24)
BUN: 17 mg/dL (ref 8–27)
Bilirubin Total: 0.7 mg/dL (ref 0.0–1.2)
CO2: 30 mmol/L — ABNORMAL HIGH (ref 20–29)
Calcium: 9.4 mg/dL (ref 8.6–10.2)
Chloride: 99 mmol/L (ref 96–106)
Creatinine, Ser: 1.08 mg/dL (ref 0.76–1.27)
Globulin, Total: 2.1 g/dL (ref 1.5–4.5)
Glucose: 95 mg/dL (ref 70–99)
Potassium: 4.5 mmol/L (ref 3.5–5.2)
Sodium: 138 mmol/L (ref 134–144)
Total Protein: 6.5 g/dL (ref 6.0–8.5)
eGFR: 72 mL/min/{1.73_m2} (ref >59)

## 2022-04-25 LAB — CBC
Hematocrit: 39.4 % (ref 37.5–51.0)
Hemoglobin: 12.9 g/dL — ABNORMAL LOW (ref 13.0–17.7)
MCH: 30.6 pg (ref 26.6–33.0)
MCHC: 32.7 g/dL (ref 31.5–35.7)
MCV: 94 fL (ref 79–97)
Platelets: 201 10*3/uL (ref 150–450)
RBC: 4.21 x10E6/uL (ref 4.14–5.80)
RDW: 12.2 % (ref 11.6–15.4)
WBC: 3.6 10*3/uL (ref 3.4–10.8)

## 2022-04-25 LAB — VITAMIN D 25 HYDROXY (VIT D DEFICIENCY, FRACTURES): Vit D, 25-Hydroxy: 37.9 ng/mL (ref 30.0–100.0)

## 2022-04-25 LAB — TSH: TSH: 1.41 u[IU]/mL (ref 0.450–4.500)

## 2022-04-25 LAB — HEMOGLOBIN A1C
Est. average glucose Bld gHb Est-mCnc: 117 mg/dL
Hgb A1c MFr Bld: 5.7 % — ABNORMAL HIGH (ref 4.8–5.6)

## 2022-07-18 DIAGNOSIS — M791 Myalgia, unspecified site: Secondary | ICD-10-CM | POA: Diagnosis not present

## 2022-07-27 DIAGNOSIS — J309 Allergic rhinitis, unspecified: Secondary | ICD-10-CM | POA: Diagnosis not present

## 2022-07-27 DIAGNOSIS — Z0000 Encounter for general adult medical examination without abnormal findings: Secondary | ICD-10-CM | POA: Diagnosis not present

## 2022-07-27 DIAGNOSIS — Z1331 Encounter for screening for depression: Secondary | ICD-10-CM | POA: Diagnosis not present

## 2022-07-27 DIAGNOSIS — I2510 Atherosclerotic heart disease of native coronary artery without angina pectoris: Secondary | ICD-10-CM | POA: Diagnosis not present

## 2022-07-27 DIAGNOSIS — Z87891 Personal history of nicotine dependence: Secondary | ICD-10-CM | POA: Diagnosis not present

## 2022-07-27 DIAGNOSIS — I10 Essential (primary) hypertension: Secondary | ICD-10-CM | POA: Diagnosis not present

## 2022-07-27 DIAGNOSIS — R42 Dizziness and giddiness: Secondary | ICD-10-CM | POA: Diagnosis not present

## 2022-07-27 DIAGNOSIS — K921 Melena: Secondary | ICD-10-CM | POA: Diagnosis not present

## 2022-07-28 DIAGNOSIS — Z1211 Encounter for screening for malignant neoplasm of colon: Secondary | ICD-10-CM | POA: Diagnosis not present

## 2022-08-12 DIAGNOSIS — H53453 Other localized visual field defect, bilateral: Secondary | ICD-10-CM | POA: Diagnosis not present

## 2022-08-12 DIAGNOSIS — H02831 Dermatochalasis of right upper eyelid: Secondary | ICD-10-CM | POA: Diagnosis not present

## 2022-08-12 DIAGNOSIS — H02834 Dermatochalasis of left upper eyelid: Secondary | ICD-10-CM | POA: Diagnosis not present

## 2022-10-07 ENCOUNTER — Other Ambulatory Visit: Payer: Self-pay | Admitting: Cardiology

## 2022-10-07 DIAGNOSIS — E782 Mixed hyperlipidemia: Secondary | ICD-10-CM

## 2022-10-07 DIAGNOSIS — I2510 Atherosclerotic heart disease of native coronary artery without angina pectoris: Secondary | ICD-10-CM

## 2022-10-15 ENCOUNTER — Telehealth: Payer: Self-pay

## 2022-10-15 DIAGNOSIS — I2510 Atherosclerotic heart disease of native coronary artery without angina pectoris: Secondary | ICD-10-CM

## 2022-10-15 DIAGNOSIS — E782 Mixed hyperlipidemia: Secondary | ICD-10-CM

## 2022-10-15 NOTE — Telephone Encounter (Signed)
Pt states that he has been in Nexlizet for 3 years and in the past 2 years he has lost 35 lbs. Pt states in the past 2 months he has been having fatigue, joint pain, shortness of breath and itching. Pt states he saw his PCP recently, had labs and was dx with anemia. Pt states that he feels these sx could be coming from the Nexlizet. Advised to stop Nexlizet and let us know in 2 weeks how he was feeling. Pt verbalized understanding and had no additional questions.

## 2022-10-28 ENCOUNTER — Ambulatory Visit (INDEPENDENT_AMBULATORY_CARE_PROVIDER_SITE_OTHER): Payer: PPO | Admitting: Gastroenterology

## 2022-10-28 ENCOUNTER — Encounter: Payer: Self-pay | Admitting: Gastroenterology

## 2022-10-28 ENCOUNTER — Other Ambulatory Visit (INDEPENDENT_AMBULATORY_CARE_PROVIDER_SITE_OTHER): Payer: PPO

## 2022-10-28 VITALS — BP 118/78 | HR 68 | Ht 70.0 in | Wt 194.0 lb

## 2022-10-28 DIAGNOSIS — R195 Other fecal abnormalities: Secondary | ICD-10-CM | POA: Diagnosis not present

## 2022-10-28 LAB — COMPREHENSIVE METABOLIC PANEL
ALT: 18 U/L (ref 0–53)
AST: 16 U/L (ref 0–37)
Albumin: 4.3 g/dL (ref 3.5–5.2)
Alkaline Phosphatase: 59 U/L (ref 39–117)
BUN: 19 mg/dL (ref 6–23)
CO2: 29 mEq/L (ref 19–32)
Calcium: 9.4 mg/dL (ref 8.4–10.5)
Chloride: 103 mEq/L (ref 96–112)
Creatinine, Ser: 1.09 mg/dL (ref 0.40–1.50)
GFR: 66.14 mL/min (ref >60.00)
Glucose, Bld: 100 mg/dL — ABNORMAL HIGH (ref 70–99)
Potassium: 4.1 mEq/L (ref 3.5–5.1)
Sodium: 139 mEq/L (ref 135–145)
Total Bilirubin: 0.5 mg/dL (ref 0.2–1.2)
Total Protein: 7.3 g/dL (ref 6.0–8.3)

## 2022-10-28 LAB — CBC WITH DIFFERENTIAL/PLATELET
Basophils Absolute: 0.1 10*3/uL (ref 0.0–0.1)
Basophils Relative: 1.2 % (ref 0.0–3.0)
Eosinophils Absolute: 0.2 10*3/uL (ref 0.0–0.7)
Eosinophils Relative: 3.3 % (ref 0.0–5.0)
HCT: 40.6 % (ref 39.0–52.0)
Hemoglobin: 13.6 g/dL (ref 13.0–17.0)
Lymphocytes Relative: 14.3 % (ref 12.0–46.0)
Lymphs Abs: 0.8 10*3/uL (ref 0.7–4.0)
MCHC: 33.4 g/dL (ref 30.0–36.0)
MCV: 90 fl (ref 78.0–100.0)
Monocytes Absolute: 0.5 10*3/uL (ref 0.1–1.0)
Monocytes Relative: 9.4 % (ref 3.0–12.0)
Neutro Abs: 4.1 10*3/uL (ref 1.4–7.7)
Neutrophils Relative %: 71.8 % (ref 43.0–77.0)
Platelets: 205 10*3/uL (ref 150.0–400.0)
RBC: 4.51 Mil/uL (ref 4.22–5.81)
RDW: 13.6 % (ref 11.5–15.5)
WBC: 5.7 10*3/uL (ref 4.0–10.5)

## 2022-10-28 NOTE — Patient Instructions (Signed)
_______________________________________________________  If your blood pressure at your visit was 140/90 or greater, please contact your primary care physician to follow up on this.  _______________________________________________________  If you are age 76 or older, your body mass index should be between 23-30. Your Body mass index is 27.84 kg/m. If this is out of the aforementioned range listed, please consider follow up with your Primary Care Provider.  If you are age 62 or younger, your body mass index should be between 19-25. Your Body mass index is 27.84 kg/m. If this is out of the aformentioned range listed, please consider follow up with your Primary Care Provider.   ________________________________________________________  The [facility] providers would like to encourage you to use [facility] to communicate with providers for non-urgent requests or questions.  Due to long hold times on the telephone, sending your provider a message by [facility] may be a faster and more efficient way to get a response.  Please allow 48 business hours for a response.  Please remember that this is for non-urgent requests.  _______________________________________________________  Your provider has requested that you go to the basement level for lab work before leaving today. Press "B" on the elevator. The lab is located at the first door on the left as you exit the elevator.  Please call with any questions or concerns.  Thank you,  Dr. Lynann Bologna

## 2022-10-28 NOTE — Progress Notes (Signed)
Chief Complaint:   Referring Provider:  Julianne Handler, NP      ASSESSMENT AND PLAN;   #1. H/O dark stools x 1-completely resolved.  Plan: -CBC, CMP -If anemic, then willing to undergo EGD. Otherwise, wait -FU if any problems or recurrence  Addendum-today his hemoglobin is normal at 13.6, MCV Nl.  Normal CMP.  Patient informed HPI:    Louis Carrillo is a 77 y.o. male  With CAD, HTN, HLD, X-lap d/t shrapnel 1969  Started nexlizet for HLD-had significant muscle cramps, abdominal bloating.  Has gotten better since it has been stopped.  During the above he had 1 episode of dark stool, 2 months ago Evaluated by Vivia Birmingham.  Was found to be mildly anemic.  Started on iron supplements which resulted in dark stools as well.  Iron has been stopped.  His stools have cleared.  Denies having any bleeding.  He feels great.  No dizziness.  No nonsteroidals.  He denies having any further abdominal pain.  He was not sure why he is here.  He denies having any heartburn.  No nausea or vomiting.  He does take baby aspirin Monday Wednesday Friday.  Trying to lose wt  BM 1/day-normal.  Wt Readings from Last 3 Encounters:  10/28/22 194 lb (88 kg)  04/23/22 193 lb 6.4 oz (87.7 kg)  10/14/21 195 lb (88.5 kg)   No blood in urine. Has appt with urology  He wants to hold off on EGD or CT.  Past GI procedures: Colonoscopy 03/20/2020 - One 6 mm polyp in the proximal ascending colon, removed with a cold snare. Resected and retrieved. Bx-tubular adenoma - Diverticulosis in the sigmoid colon. - Non-bleeding external and internal hemorrhoids. - No need to repeat due to age unless problems  SH- Betsy's husband.  He is a Product/process development scientist.  Past Medical History:  Diagnosis Date   Abnormal nuclear stress test 11/09/2019   Benign hypertension 09/25/2020   Benign prostatic hyperplasia 09/25/2020   BPH without obstruction/lower urinary tract symptoms 04/10/2020   CAD (coronary artery  disease) 12/19/2019   Cancer ([facility]) 2014   bladder cancer    Chest tightness 10/10/2019   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   Coronary arteriosclerosis 09/25/2020   Encounter for fitting and adjustment of hearing aid 04/09/2021   Encounter for immunization 09/25/2020   Essential (primary) hypertension 10/10/2019   Ex-smoker 10/10/2019   Foot pain 09/25/2020   Headache 09/25/2020   Hearing loss 09/25/2020   History of bladder cancer 04/10/2020   Hyperlipidemia    Hypertrophy of prostate without urinary obstruction and other lower urinary tract symptoms (LUTS) 09/25/2020   Mixed dyslipidemia 11/09/2019   Peptic ulcer 09/25/2020   Tobacco use disorder 09/25/2020    Past Surgical History:  Procedure Laterality Date   BLADDER TUMOR EXCISION  2014   x3   COLONOSCOPY  12/24/2014   Arlen Legendre-polyps   INGUINAL HERNIA REPAIR  age 87   pt unsure what surgery   POLYPECTOMY     WOUND DEBRIDEMENT  1969    Family History  Problem Relation Age of Onset   Hypertension Mother    Hypertension Father    Hypertension Brother    Heart attack Maternal Grandmother    Alcohol abuse Paternal Grandfather    Colon cancer Neg Hx    Colon polyps Neg Hx    Esophageal cancer Neg Hx    Rectal cancer Neg Hx    Stomach cancer Neg Hx  Social History   Tobacco Use   Smoking status: Former    Types: Cigarettes    Quit date: 07/27/1998    Years since quitting: 24.2   Smokeless tobacco: Never  Vaping Use   Vaping Use: Never used  Substance Use Topics   Alcohol use: Yes    Alcohol/week: 15.0 standard drinks of alcohol    Types: 15 Cans of beer per week   Drug use: No    Current Outpatient Medications  Medication Sig Dispense Refill   aspirin EC 81 MG tablet Take 81 mg by mouth every other day. Swallow whole.     losartan-hydrochlorothiazide (HYZAAR) 50-12.5 MG tablet Take 1 tablet by mouth daily. 90 tablet 3   nitroGLYCERIN  (NITROSTAT) 0.4 MG SL tablet Place 1 tablet (0.4 mg total) under the tongue every 5 (five) minutes as needed for chest pain. 25 tablet 6   Bempedoic Acid-Ezetimibe (NEXLIZET) 180-10 MG TABS Take 1 tablet by mouth once daily (Patient not taking: Reported on 10/28/2022) 30 tablet 11   Pitavastatin Calcium 2 MG TABS Take 1 tablet (2 mg total) by mouth daily. (Patient not taking: Reported on 10/28/2022) 30 tablet 12   No current facility-administered medications for this visit.    Allergies  Allergen Reactions   Bempedoic Acid-Ezetimibe Shortness Of Breath   Atorvastatin     Other reaction(s): Muscle pain   Citalopram     Other reaction(s): NAUSEA,VOMITING   Omeprazole Diarrhea    Other reaction(s): NAUSEA,VOMITING   Ranitidine     Other reaction(s): NAUSEA,VOMITING    Review of Systems:  Constitutional: Denies fever, chills, diaphoresis, appetite change and fatigue.  HEENT: Denies photophobia, eye pain, redness, hearing loss, ear pain, congestion, sore throat, rhinorrhea, sneezing, mouth sores, neck pain, neck stiffness and tinnitus.   Respiratory: Denies SOB, DOE, cough, chest tightness,  and wheezing.   Cardiovascular: Denies chest pain, palpitations and leg swelling.  Genitourinary: Denies dysuria, urgency, frequency, hematuria, flank pain and difficulty urinating.  Musculoskeletal: Denies myalgias, back pain, joint swelling, arthralgias and gait problem.  Skin: No rash.  Neurological: Denies dizziness, seizures, syncope, weakness, light-headedness, numbness and headaches.  Hematological: Denies adenopathy. Easy bruising, personal or family bleeding history  Psychiatric/Behavioral: No anxiety or depression     Physical Exam:    BP 118/78   Pulse 68   Ht 5\' 10"  (1.778 m)   Wt 194 lb (88 kg)   SpO2 96%   BMI 27.84 kg/m  Wt Readings from Last 3 Encounters:  10/28/22 194 lb (88 kg)  04/23/22 193 lb 6.4 oz (87.7 kg)  10/14/21 195 lb (88.5 kg)   Constitutional:   Well-developed, in no acute distress. Psychiatric: Normal mood and affect. Behavior is normal. HEENT: Pupils normal.  Conjunctivae are normal. No scleral icterus. Neck supple.  Cardiovascular: Normal rate, regular rhythm. No edema Pulmonary/chest: Effort normal and breath sounds normal. No wheezing, rales or rhonchi. Abdominal: Soft, nondistended. Nontender. Bowel sounds active throughout. There are no masses palpable. No hepatomegaly. Rectal: Deferred Neurological: Alert and oriented to person place and time. Skin: Skin is warm and dry. No rashes noted.  Data Reviewed: I have personally reviewed following labs and imaging studies  CBC:    Latest Ref Rng & Units 04/24/2022    8:05 AM 05/16/2021    8:22 AM 01/23/2019   10:11 AM  CBC  WBC 3.4 - 10.8 x10E3/uL 3.6  4.0  5.3   Hemoglobin 13.0 - 17.7 g/dL 16.1  09.6  04.5   Hematocrit  37.5 - 51.0 % 39.4  40.5  44.9   Platelets 150 - 450 x10E3/uL 201  172  171     CMP:    Latest Ref Rng & Units 04/24/2022    8:05 AM 09/12/2021    8:30 AM 06/27/2021    8:13 AM  CMP  Glucose 70 - 99 mg/dL 95     BUN 8 - 27 mg/dL 17     Creatinine 4.09 - 1.27 mg/dL 8.11     Sodium 914 - 782 mmol/L 138     Potassium 3.5 - 5.2 mmol/L 4.5     Chloride 96 - 106 mmol/L 99     CO2 20 - 29 mmol/L 30     Calcium 8.6 - 10.2 mg/dL 9.4     Total Protein 6.0 - 8.5 g/dL 6.5  6.5  6.4   Total Bilirubin 0.0 - 1.2 mg/dL 0.7  0.5  0.6   Alkaline Phos 44 - 121 IU/L 51  78  73   AST 0 - 40 IU/L 19  15  18    ALT 0 - 44 IU/L 20  18  18      GFR: CrCl cannot be calculated (Patient's most recent lab result is older than the maximum 21 days allowed.). Liver Function Tests: No results for input(s): "AST", "ALT", "ALKPHOS", "BILITOT", "PROT", "ALBUMIN" in the last 168 hours. No results for input(s): "LIPASE", "AMYLASE" in the last 168 hours. No results for input(s): "AMMONIA" in the last 168 hours. Coagulation Profile: No results for input(s): "INR", "PROTIME" in the  last 168 hours. HbA1C: No results for input(s): "HGBA1C" in the last 72 hours. Lipid Profile: No results for input(s): "CHOL", "HDL", "LDLCALC", "TRIG", "CHOLHDL", "LDLDIRECT" in the last 72 hours. Thyroid Function Tests: No results for input(s): "TSH", "T4TOTAL", "FREET4", "T3FREE", "THYROIDAB" in the last 72 hours. Anemia Panel: No results for input(s): "VITAMINB12", "FOLATE", "FERRITIN", "TIBC", "IRON", "RETICCTPCT" in the last 72 hours.  No results found for this or any previous visit (from the past 240 hour(s)).    Radiology Studies: No results found.    Edman Circle, MD 10/28/2022, 9:26 AM  Cc: Julianne Handler, NP

## 2022-10-30 NOTE — Addendum Note (Signed)
Addended by: Eleonore Chiquito on: 10/30/2022 11:35 AM   Modules accepted: Orders

## 2022-10-30 NOTE — Telephone Encounter (Signed)
Spoke with pt who states that a day or two after stopping the Nexlizet he felt like a new person. Advised that I would discuss with Dr. Tomie China and send to the lipid clinic if you agree. Pt agreed with the plan.

## 2022-11-03 ENCOUNTER — Encounter: Payer: Self-pay | Admitting: Student

## 2022-11-03 ENCOUNTER — Ambulatory Visit: Payer: PPO | Attending: Cardiology | Admitting: Student

## 2022-11-03 ENCOUNTER — Telehealth: Payer: Self-pay | Admitting: Pharmacist

## 2022-11-03 DIAGNOSIS — E782 Mixed hyperlipidemia: Secondary | ICD-10-CM

## 2022-11-03 NOTE — Patient Instructions (Addendum)
Your Results:             Your most recent labs Goal  Total Cholesterol 148 < 200  Triglycerides 108 < 150  HDL (happy/good cholesterol) 44 > 40  LDL (lousy/bad cholesterol 81 < 70   Medication changes: We will start the process to get PCSK9i (Repatha or Praluent) covered by your insurance.  Once the prior authorization is complete, we will call you to let you know and confirm pharmacy information.    Praluent is a cholesterol medication that improved your body's ability to get rid of "bad cholesterol" known as LDL. It can lower your LDL up to 60%. It is an injection that is given under the skin every 2 weeks. The most common side effects of Praluent include runny nose, symptoms of the common cold, rarely flu or flu-like symptoms, back/muscle pain in about 3-4% of the patients, and redness, pain, or bruising at the injection site.    Repatha is a cholesterol medication that improved your body's ability to get rid of "bad cholesterol" known as LDL. It can lower your LDL up to 60%! It is an injection that is given under the skin every 2 weeks. The most common side effects of Repatha include runny nose, symptoms of the common cold, rarely flu or flu-like symptoms, back/muscle pain in about 3-4% of the patients, and redness, pain, or bruising at the injection site.   Lab orders: We want to repeat labs after 2-3 months.  We will send you a lab order to remind you once we get closer to that time.

## 2022-11-03 NOTE — Progress Notes (Signed)
Patient ID: Louis Carrillo                 DOB: Oct 03, 1945                    MRN: [id number]      HPI: Louis Carrillo is a 77 y.o. male patient referred to lipid clinic by Dr. Tomie China. PMH is significant for CAD, HTN, BPH, HDL.  Patient presented today for lipid clinic reports he can not tolerate any statins. His joints hurts so bad on statins. He does not recall the name of the all statins he had tried in the past. He was on Nexlizet for last 4 years without any issue. Couple months ago he started having joint pain, SOB and fatigue. When he held Nexlizet his symptoms went away and when he restarted his symptoms were back. He eats fairly healthy diet and does not eat out. He exercise regularly. We reviewed non-statin LDLc lowering options - PCSK-9 inhibitors and inclisiran.  Discussed mechanisms of action, dosing, side effects and potential decreases in LDL cholesterol.  Also reviewed cost information and potential options for patient assistance.    Diet: do not eat fried food, does not eat red meat. Lost 30 lbs over last 2 years, no soft drinks  Exercise: walk 1 mile every other day on treadmill   Family History:  Mother: heart disease  MGM: heart attack at age of 54  Father: cholesterol, CKD, MI   Social History:  Alcohol: 12 beers per week  Smoking: quit 2000 Labs: Lipid Panel     Component Value Date/Time   CHOL 148 04/24/2022 0805   TRIG 108 04/24/2022 0805   HDL 44 04/24/2022 0805   CHOLHDL 3.4 04/24/2022 0805   LDLCALC 84 04/24/2022 0805   LABVLDL 20 04/24/2022 0805    Past Medical History:  Diagnosis Date   Abnormal nuclear stress test 11/09/2019   Benign hypertension 09/25/2020   Benign prostatic hyperplasia 09/25/2020   BPH without obstruction/lower urinary tract symptoms 04/10/2020   CAD (coronary artery disease) 12/19/2019   Cancer (HCC) 2014   bladder cancer    Chest tightness 10/10/2019   Combined arterial insufficiency and corporo-venous occlusive  erectile dysfunction 04/10/2020   Coronary arteriosclerosis 09/25/2020   Encounter for fitting and adjustment of hearing aid 04/09/2021   Encounter for immunization 09/25/2020   Essential (primary) hypertension 10/10/2019   Ex-smoker 10/10/2019   Foot pain 09/25/2020   Headache 09/25/2020   Hearing loss 09/25/2020   History of bladder cancer 04/10/2020   Hyperlipidemia    Hypertrophy of prostate without urinary obstruction and other lower urinary tract symptoms (LUTS) 09/25/2020   Mixed dyslipidemia 11/09/2019   Peptic ulcer 09/25/2020   Tobacco use disorder 09/25/2020    Current Outpatient Medications on File Prior to Visit  Medication Sig Dispense Refill   aspirin EC 81 MG tablet Take 81 mg by mouth every other day. Swallow whole.     Bempedoic Acid-Ezetimibe (NEXLIZET) 180-10 MG TABS Take 1 tablet by mouth once daily (Patient not taking: Reported on 10/28/2022) 30 tablet 11   losartan-hydrochlorothiazide (HYZAAR) 50-12.5 MG tablet Take 1 tablet by mouth daily. 90 tablet 3   nitroGLYCERIN (NITROSTAT) 0.4 MG SL tablet Place 1 tablet (0.4 mg total) under the tongue every 5 (five) minutes as needed for chest pain. 25 tablet 6   Pitavastatin Calcium 2 MG TABS Take 1 tablet (2 mg total) by mouth daily. (Patient not taking: Reported on 10/28/2022) 30 tablet 12  No current facility-administered medications on file prior to visit.    Allergies  Allergen Reactions   Bempedoic Acid-Ezetimibe Shortness Of Breath   Atorvastatin     Other reaction(s): Muscle pain   Citalopram     Other reaction(s): NAUSEA,VOMITING   Omeprazole Diarrhea    Other reaction(s): NAUSEA,VOMITING   Ranitidine     Other reaction(s): NAUSEA,VOMITING    Assessment/Plan:  1. Hyperlipidemia -  Problem  Hyperlipidemia   Current Medications: none Intolerances: Nexlizet- fatigue, SOB, joint pain, Atorvastatin - joint pain, pitavastatin 2 mg   Risk Factors: CAD, CAC score 188, HTN, family hx of ASCVD LDL goal:  <70 mg/dl    Hyperlipidemia Assessment:  LDL goal: < 70 mg/dl last LDLc 84 mg/dl (16/1096) while on Nexlizet  Intolerance to Nexlizet and pitavastatin 4 mg and 2 mg    Discussed next potential options (PCSK-9 inhibitors,  and inclisiran); cost, dosing efficacy, side effects    Plan: Will apply for PA for PCSK9i; will inform patient upon approval  Lipid lab due in 2-3 months after starting PCSK9i    Thank you,  Carmela Hurt, Pharm.D [facility] A Division of [facility] [address], [place], [place] [address]  Phone: [phone]; Fax: [phone]

## 2022-11-03 NOTE — Assessment & Plan Note (Addendum)
Assessment:  LDL goal: < 70 mg/dl last LDLc 84 mg/dl (40/1027) while on Nexlizet  Intolerance to Nexlizet and pitavastatin 4 mg and 2 mg    Discussed next potential options (PCSK-9 inhibitors,  and inclisiran); cost, dosing efficacy, side effects    Plan: Will apply for PA for PCSK9i; will inform patient upon approval  Lipid lab due in 2-3 months after starting [facility]

## 2022-11-04 ENCOUNTER — Other Ambulatory Visit (HOSPITAL_COMMUNITY): Payer: Self-pay

## 2022-11-04 ENCOUNTER — Telehealth: Payer: Self-pay

## 2022-11-04 DIAGNOSIS — E782 Mixed hyperlipidemia: Secondary | ICD-10-CM

## 2022-11-04 DIAGNOSIS — F4312 Post-traumatic stress disorder, chronic: Secondary | ICD-10-CM

## 2022-11-04 HISTORY — DX: Post-traumatic stress disorder, chronic: F43.12

## 2022-11-04 NOTE — Telephone Encounter (Signed)
Pharmacy Patient Advocate Encounter   Received notification from [facility] that prior authorization for REPATHA is required/requested.   PA submitted to [employer] via FAX/EMAIL   Status is pending

## 2022-11-05 MED ORDER — REPATHA SURECLICK 140 MG/ML ~~LOC~~ SOAJ: 140.0000 mg | SUBCUTANEOUS | 3 refills | Status: DC

## 2022-11-05 NOTE — Telephone Encounter (Signed)
Pharmacy Patient Advocate Encounter  Prior Authorization for REPATHA has been APPROVED by [employer] from 6.26.24 to 12.26.24.  

## 2022-11-05 NOTE — Telephone Encounter (Signed)
PA approved see other encounter

## 2022-11-05 NOTE — Telephone Encounter (Signed)
Pharmacy Patient Advocate Encounter  Prior Authorization for REPATHA has been APPROVED by [employer] from 6.26.24 to 12.26.24.

## 2022-11-16 ENCOUNTER — Telehealth: Payer: Self-pay | Admitting: Student

## 2022-11-16 NOTE — Telephone Encounter (Signed)
Patient coming into the [place] office as he states the Dr. Allena Katz switched his cholesterol medicine to "the shot" and he takes that medication on the first, and fifteenth of each month. He has only attempted to take it one time thus far and when he did "he felt he hit bone and jerked it out" meaning the medication was not taken properly, or he received the incorrect dose due to error. Patient states he has been called in to do blood work and doesn't feel like this is necessary yet as he has not really "taken" the medication.  Please advise.  Best number to reach patient [phone]

## 2022-11-16 NOTE — Telephone Encounter (Signed)
Patient states not sure how much of the medication he actually received ue to "jerking the needle out".  Advised continue with next dose in 2 weeks.  Labs can be done 2-3 months so he is good to wait for 3 months after the next injection.  He states understanding.

## 2022-11-18 ENCOUNTER — Ambulatory Visit: Payer: PPO | Attending: Cardiology | Admitting: Cardiology

## 2022-11-18 ENCOUNTER — Encounter: Payer: Self-pay | Admitting: Cardiology

## 2022-11-18 VITALS — BP 128/74 | HR 64 | Ht 70.0 in | Wt 193.6 lb

## 2022-11-18 DIAGNOSIS — I2510 Atherosclerotic heart disease of native coronary artery without angina pectoris: Secondary | ICD-10-CM

## 2022-11-18 DIAGNOSIS — I10 Essential (primary) hypertension: Secondary | ICD-10-CM

## 2022-11-18 DIAGNOSIS — E782 Mixed hyperlipidemia: Secondary | ICD-10-CM

## 2022-11-18 NOTE — Progress Notes (Signed)
Cardiology Office Note:    Date:  11/18/2022   ID:  [name], DOB [birth year], MRN [id number]  PCP:  Julianne Handler, NP  Cardiologist:  Garwin Brothers, MD   Referring MD: Julianne Handler, NP    ASSESSMENT:    1. Coronary artery disease involving native coronary artery of native heart without angina pectoris   2. Essential (primary) hypertension   3. Mixed dyslipidemia    PLAN:    In order of problems listed above:  Coronary artery disease: Secondary prevention stressed with the patient.  Importance of compliance with diet medication stressed and patient verbalized standing.  He was advised to walk at least half an hour a day 5 days a week and he promises to do so. Essential hypertension: Blood pressure stable and diet was emphasized.  Lifestyle modification urged. Mixed dyslipidemia: He is following the instructions of the lipid clinic and taking his PCSK9 injections regularly.  He has been meticulous with diet.  He has a follow-up appointment coming up with them for blood work. Cigarette smoker: He promises never to go back to smoking. Patient will be seen in follow-up appointment in 6 months or earlier if the patient has any concerns. Patient will be seen in follow-up appointment in 6 months or earlier if the patient has any concerns.    Medication Adjustments/Labs and Tests Ordered: Current medicines are reviewed at length with the patient today.  Concerns regarding medicines are outlined above.  No orders of the defined types were placed in this encounter.  No orders of the defined types were placed in this encounter.    No chief complaint on file.    History of Present Illness:    Louis Carrillo is a 77 y.o. male.  Patient has past medical history of coronary artery disease, essential hypertension, mixed dyslipidemia.  He denies any problems at this time and takes care of activities of daily living.  No chest pain orthopnea or PND.  At the time of my  evaluation, the patient is alert awake oriented and in no distress.  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   Coronary arteriosclerosis 09/25/2020   Encounter for fitting and adjustment of hearing aid 04/09/2021   Encounter for immunization 09/25/2020   Essential (primary) hypertension 10/10/2019   Ex-smoker 10/10/2019   Foot pain 09/25/2020   Headache 09/25/2020   Hearing loss 09/25/2020   History of bladder cancer 04/10/2020   Hyperlipidemia    Hypertrophy of prostate without urinary obstruction and other lower urinary tract symptoms (LUTS) 09/25/2020   Mixed dyslipidemia 11/09/2019   Peptic ulcer 09/25/2020   Post-traumatic stress disorder, chronic 11/04/2022   Tobacco use disorder 09/25/2020    Past Surgical History:  Procedure Laterality Date   BLADDER TUMOR EXCISION  2014   x3   COLONOSCOPY  12/24/2014   Gupta-polyps   INGUINAL HERNIA REPAIR  age 14   pt unsure what surgery   POLYPECTOMY     WOUND DEBRIDEMENT  1969    Current Medications: Current Meds  Medication Sig   aspirin EC 81 MG tablet Take 81 mg by mouth every Monday, Wednesday, and Friday. Swallow  whole.   Evolocumab (REPATHA SURECLICK) 140 MG/ML SOAJ Inject 140 mg into the skin every 14 (fourteen) days.   losartan-hydrochlorothiazide (HYZAAR) 50-12.5 MG tablet Take 1 tablet by mouth daily.   nitroGLYCERIN (NITROSTAT) 0.4 MG SL tablet Place 1 tablet (0.4 mg total) under the tongue every 5 (five) minutes as needed for chest pain.     Allergies:   Bempedoic acid-ezetimibe, Atorvastatin, Citalopram, Omeprazole, and  Ranitidine   Social History   Socioeconomic History   Marital status: Married    Spouse name: Not on file   Number of children: Not on file   Years of education: Not on file   Highest education level: Not on file  Occupational History   Not on file  Tobacco Use   Smoking status: Former    Types: Cigarettes    Quit date: 07/27/1998    Years since quitting: 24.3   Smokeless tobacco: Never  Vaping Use   Vaping Use: Never used  Substance and Sexual Activity   Alcohol use: Yes    Alcohol/week: 15.0 standard drinks of alcohol    Types: 15 Cans of beer per week   Drug use: No   Sexual activity: Not on file  Other Topics Concern   Not on file  Social History Narrative   Not on file   Social Determinants of Health   Financial Resource Strain: Not on file  Food Insecurity: Not on file  Transportation Needs: Not on file  Physical Activity: Not on file  Stress: Not on file  Social Connections: Not on file     Family History: The patient's family history includes Alcohol abuse in his paternal grandfather; Heart attack in his maternal grandmother; Hypertension in his brother, father, and mother. There is no history of Colon cancer, Colon polyps, Esophageal cancer, Rectal cancer, or Stomach cancer.  ROS:   Please see the history of present illness.    All other systems reviewed and are negative.  EKGs/Labs/Other Studies Reviewed:    The following studies were reviewed today: I discussed my findings with the patient at length.   Recent Labs: 04/24/2022: TSH 1.410 10/28/2022: ALT 18; BUN 19; Creatinine, Ser 1.09; Hemoglobin 13.6; Platelets 205.0; Potassium 4.1; Sodium 139  Recent Lipid Panel    Component Value Date/Time   CHOL 148 04/24/2022 0805   TRIG 108 04/24/2022 0805   HDL 44 04/24/2022 0805   CHOLHDL 3.4 04/24/2022 0805   LDLCALC 84 04/24/2022 0805    Physical Exam:    VS:  BP 128/74   Pulse 64   Ht 5\' 10"  (1.778 m)   Wt 193 lb 9.6 oz (87.8 kg)   SpO2 97%    BMI 27.78 kg/m     Wt Readings from Last 3 Encounters:  11/18/22 193 lb 9.6 oz (87.8 kg)  10/28/22 194 lb (88 kg)  04/23/22 193 lb 6.4 oz (87.7 kg)     GEN: Patient is in no acute distress HEENT: Normal NECK: No JVD; No carotid bruits LYMPHATICS: No lymphadenopathy CARDIAC: Hear sounds regular, 2/6 systolic murmur at the apex. RESPIRATORY:  Clear to auscultation without rales, wheezing or rhonchi  ABDOMEN: Soft, non-tender, non-distended MUSCULOSKELETAL:  No edema; No deformity  SKIN: Warm and dry NEUROLOGIC:  Alert and oriented x 3 PSYCHIATRIC:  Normal affect   Signed, Garwin Brothers, MD  11/18/2022 8:05 AM    [facility]

## 2022-11-18 NOTE — Patient Instructions (Signed)
Medication Instructions:  Your physician recommends that you continue on your current medications as directed. Please refer to the Current Medication list given to you today.  *If you need a refill on your cardiac medications before your next appointment, please call your pharmacy*   Lab Work: None If you have labs (blood work) drawn today and your tests are completely normal, you will receive your results only by: MyChart Message (if you have MyChart) OR A paper copy in the mail If you have any lab test that is abnormal or we need to change your treatment, we will call you to review the results.   Testing/Procedures: None   Follow-Up: At [facility], you and your health needs are our priority.  As part of our continuing mission to provide you with exceptional heart care, we have created designated Provider Care Teams.  These Care Teams include your primary Cardiologist (physician) and Advanced Practice Providers (APPs -  Physician Assistants and Nurse Practitioners) who all work together to provide you with the care you need, when you need it.  We recommend signing up for the patient portal called "MyChart".  Sign up information is provided on this After Visit Summary.  MyChart is used to connect with patients for Virtual Visits (Telemedicine).  Patients are able to view lab/test results, encounter notes, upcoming appointments, etc.  Non-urgent messages can be sent to your provider as well.   To learn more about what you can do with MyChart, go to [email or web address].    Your next appointment:   6 month(s)  Provider:   Belva Crome, MD    Other Instructions None

## 2022-11-29 DIAGNOSIS — M79662 Pain in left lower leg: Secondary | ICD-10-CM | POA: Diagnosis not present

## 2022-11-29 DIAGNOSIS — S86812A Strain of other muscle(s) and tendon(s) at lower leg level, left leg, initial encounter: Secondary | ICD-10-CM | POA: Diagnosis not present

## 2022-11-29 DIAGNOSIS — X500XXA Overexertion from strenuous movement or load, initial encounter: Secondary | ICD-10-CM | POA: Diagnosis not present

## 2022-11-29 DIAGNOSIS — S86112A Strain of other muscle(s) and tendon(s) of posterior muscle group at lower leg level, left leg, initial encounter: Secondary | ICD-10-CM | POA: Diagnosis not present

## 2022-11-29 DIAGNOSIS — M7989 Other specified soft tissue disorders: Secondary | ICD-10-CM | POA: Diagnosis not present

## 2022-12-08 DIAGNOSIS — S93402A Sprain of unspecified ligament of left ankle, initial encounter: Secondary | ICD-10-CM | POA: Diagnosis not present

## 2023-02-26 DIAGNOSIS — E782 Mixed hyperlipidemia: Secondary | ICD-10-CM | POA: Diagnosis not present

## 2023-02-27 LAB — LIPID PANEL
Chol/HDL Ratio: 2.5 {ratio} (ref 0.0–5.0)
Cholesterol, Total: 130 mg/dL (ref 100–199)
HDL: 51 mg/dL (ref >39)
LDL Chol Calc (NIH): 64 mg/dL (ref 0–99)
Triglycerides: 78 mg/dL (ref 0–149)
VLDL Cholesterol Cal: 15 mg/dL (ref 5–40)

## 2023-05-13 ENCOUNTER — Other Ambulatory Visit (HOSPITAL_COMMUNITY): Payer: Self-pay

## 2023-05-13 ENCOUNTER — Telehealth: Payer: Self-pay | Admitting: Pharmacy Technician

## 2023-05-13 ENCOUNTER — Telehealth: Payer: Self-pay | Admitting: Cardiology

## 2023-05-13 NOTE — Telephone Encounter (Signed)
 Please complete a PA for Repatha.

## 2023-05-13 NOTE — Telephone Encounter (Signed)
 Patient came by office and stated he needs an updated PA for [facility]- he is out and needs to take it today.  Please call patient once PA is started.

## 2023-05-13 NOTE — Telephone Encounter (Signed)
 Pharmacy Patient Advocate Encounter   Received notification from Pt Calls Messages that prior authorization for repatha  is required/requested.   Insurance verification completed.   The patient is insured through [facility] [employer] .   Per test claim: PA required; PA submitted to above mentioned insurance via CoverMyMeds Key/confirmation #/EOC [id number] Status is pending

## 2023-05-14 ENCOUNTER — Other Ambulatory Visit (HOSPITAL_COMMUNITY): Payer: Self-pay

## 2023-05-14 NOTE — Telephone Encounter (Signed)
Pt aware that PA has been approved.  

## 2023-05-14 NOTE — Telephone Encounter (Signed)
 Pharmacy Patient Advocate Encounter  Received notification from [employer] that Prior Authorization for repatha  has been APPROVED from 05/13/23 to 05/12/24. Ran test claim, Copay is $117.50 3 months. This test claim was processed through [facility]- copay amounts may vary at other pharmacies due to pharmacy/plan contracts, or as the patient moves through the different stages of their insurance plan.   PA #/Case ID/Reference #: [id number]

## 2023-05-19 ENCOUNTER — Telehealth: Payer: Self-pay

## 2023-05-19 DIAGNOSIS — I2510 Atherosclerotic heart disease of native coronary artery without angina pectoris: Secondary | ICD-10-CM

## 2023-05-19 DIAGNOSIS — E782 Mixed hyperlipidemia: Secondary | ICD-10-CM | POA: Diagnosis not present

## 2023-05-19 DIAGNOSIS — I10 Essential (primary) hypertension: Secondary | ICD-10-CM | POA: Diagnosis not present

## 2023-05-19 NOTE — Telephone Encounter (Signed)
Pt needs labs prior to appt

## 2023-05-20 LAB — COMPREHENSIVE METABOLIC PANEL
ALT: 16 [IU]/L (ref 0–44)
AST: 15 [IU]/L (ref 0–40)
Albumin: 4.2 g/dL (ref 3.8–4.8)
Alkaline Phosphatase: 74 [IU]/L (ref 44–121)
BUN/Creatinine Ratio: 16 (ref 10–24)
BUN: 16 mg/dL (ref 8–27)
Bilirubin Total: 0.5 mg/dL (ref 0.0–1.2)
CO2: 24 mmol/L (ref 20–29)
Calcium: 9.6 mg/dL (ref 8.6–10.2)
Chloride: 104 mmol/L (ref 96–106)
Creatinine, Ser: 0.98 mg/dL (ref 0.76–1.27)
Globulin, Total: 2.2 g/dL (ref 1.5–4.5)
Glucose: 92 mg/dL (ref 70–99)
Potassium: 4.6 mmol/L (ref 3.5–5.2)
Sodium: 141 mmol/L (ref 134–144)
Total Protein: 6.4 g/dL (ref 6.0–8.5)
eGFR: 80 mL/min/{1.73_m2} (ref >59)

## 2023-05-20 LAB — CBC
Hematocrit: 46.5 % (ref 37.5–51.0)
Hemoglobin: 15 g/dL (ref 13.0–17.7)
MCH: 29.9 pg (ref 26.6–33.0)
MCHC: 32.3 g/dL (ref 31.5–35.7)
MCV: 93 fL (ref 79–97)
Platelets: 181 10*3/uL (ref 150–450)
RBC: 5.02 x10E6/uL (ref 4.14–5.80)
RDW: 13.1 % (ref 11.6–15.4)
WBC: 4.7 10*3/uL (ref 3.4–10.8)

## 2023-05-20 LAB — LIPID PANEL
Chol/HDL Ratio: 2.7 {ratio} (ref 0.0–5.0)
Cholesterol, Total: 128 mg/dL (ref 100–199)
HDL: 48 mg/dL (ref >39)
LDL Chol Calc (NIH): 62 mg/dL (ref 0–99)
Triglycerides: 95 mg/dL (ref 0–149)
VLDL Cholesterol Cal: 18 mg/dL (ref 5–40)

## 2023-05-20 LAB — TSH: TSH: 1.86 u[IU]/mL (ref 0.450–4.500)

## 2023-05-21 ENCOUNTER — Ambulatory Visit: Payer: PPO | Attending: Cardiology | Admitting: Cardiology

## 2023-05-21 ENCOUNTER — Encounter: Payer: Self-pay | Admitting: Cardiology

## 2023-05-21 VITALS — BP 126/62 | HR 62 | Ht 70.0 in | Wt 196.8 lb

## 2023-05-21 DIAGNOSIS — I10 Essential (primary) hypertension: Secondary | ICD-10-CM | POA: Diagnosis not present

## 2023-05-21 DIAGNOSIS — E782 Mixed hyperlipidemia: Secondary | ICD-10-CM

## 2023-05-21 DIAGNOSIS — I2510 Atherosclerotic heart disease of native coronary artery without angina pectoris: Secondary | ICD-10-CM | POA: Diagnosis not present

## 2023-05-21 NOTE — Progress Notes (Signed)
 Cardiology Office Note:    Date:  05/21/2023   ID:  Louis Carrillo, DOB 10/21/1945, MRN [id number]  PCP:  Benjamine Lauraine DASEN, NP  Cardiologist:  Jennifer JONELLE Crape, MD   Referring MD: Benjamine Lauraine DASEN, NP    ASSESSMENT:    1. Coronary artery disease involving native coronary artery of native heart without angina pectoris   2. Essential (primary) hypertension   3. Mixed dyslipidemia    PLAN:    In order of problems listed above:  Coronary artery disease: Secondary prevention stressed with the patient.  Importance of compliance with diet medication stressed and patient verbalized standing.  He is doing well with exercise and I congratulated him about this. Essential hypertension: Blood pressure stable and diet was emphasized.  Lifestyle modification urged. Mixed dyslipidemia: On lipid-lowering medications with PCSK9 and lab work was reviewed and discussed with the patient at length lifestyle modification and diet reemphasized. Patient will be seen in follow-up appointment in 6 months or earlier if the patient has any concerns.    Medication Adjustments/Labs and Tests Ordered: Current medicines are reviewed at length with the patient today.  Concerns regarding medicines are outlined above.  Orders Placed This Encounter  Procedures   EKG 12-Lead   No orders of the defined types were placed in this encounter.    No chief complaint on file.    History of Present Illness:    Louis Carrillo is a 78 y.o. male.  Patient has past medical history of coronary artery disease, essential hypertension and mixed dyslipidemia.  He denies any problems at this time and takes care of activities of daily living.  No chest pain orthopnea or PND.  He exercises on a regular basis.  Past Medical History:  Diagnosis Date   Abnormal nuclear stress test 11/09/2019   Benign hypertension 09/25/2020   Benign prostatic hyperplasia 09/25/2020   BPH without obstruction/lower urinary tract symptoms  04/10/2020   CAD (coronary artery disease) 12/19/2019   Cancer (HCC) 2014   bladder cancer    Chest tightness 10/10/2019   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   Coronary arteriosclerosis 09/25/2020   Encounter for fitting and adjustment of hearing aid 04/09/2021   Encounter for immunization 09/25/2020   Essential (primary) hypertension 10/10/2019   Ex-smoker 10/10/2019   Foot pain 09/25/2020   Headache 09/25/2020   Hearing loss 09/25/2020   History of bladder cancer 04/10/2020   Hyperlipidemia    Hypertrophy of prostate without urinary obstruction and other lower urinary tract symptoms (LUTS) 09/25/2020   Mixed dyslipidemia 11/09/2019   Peptic ulcer 09/25/2020   Post-traumatic stress disorder, chronic 11/04/2022   Tobacco use disorder 09/25/2020    Past Surgical History:  Procedure Laterality Date   BLADDER TUMOR EXCISION  2014   x3   COLONOSCOPY  12/24/2014   Gupta-polyps   INGUINAL HERNIA REPAIR  age 67   pt unsure what surgery   POLYPECTOMY     WOUND DEBRIDEMENT  1969    Current Medications: Current Meds  Medication Sig   aspirin  EC 81 MG tablet Take 81 mg by mouth every Monday, Wednesday, and Friday. Swallow whole.   Evolocumab  (REPATHA  SURECLICK) 140 MG/ML SOAJ Inject 140 mg into the skin every 14 (fourteen) days.   losartan -hydrochlorothiazide (HYZAAR) 50-12.5 MG tablet Take 1 tablet by mouth daily.   nitroGLYCERIN  (NITROSTAT ) 0.4 MG SL tablet Place 1 tablet (0.4 mg total) under the tongue every 5 (five) minutes as needed for chest pain.  Allergies:   Bempedoic acid-ezetimibe , Atorvastatin, Citalopram, Omeprazole, and Ranitidine   Social History   Socioeconomic History   Marital status: Married    Spouse name: Not on file   Number of children: Not on file   Years of education: Not on file   Highest education level: Not on file  Occupational History   Not on file  Tobacco Use   Smoking status:  Former    Current packs/day: 0.00    Types: Cigarettes    Quit date: 07/27/1998    Years since quitting: 24.8   Smokeless tobacco: Never  Vaping Use   Vaping status: Never Used  Substance and Sexual Activity   Alcohol use: Yes    Alcohol/week: 15.0 standard drinks of alcohol    Types: 15 Cans of beer per week   Drug use: No   Sexual activity: Not on file  Other Topics Concern   Not on file  Social History Narrative   Not on file   Social Drivers of Health   Financial Resource Strain: Not on file  Food Insecurity: Not on file  Transportation Needs: Not on file  Physical Activity: Not on file  Stress: Not on file  Social Connections: Not on file     Family History: The patient's family history includes Alcohol abuse in his paternal grandfather; Heart attack in his maternal grandmother; Hypertension in his brother, father, and mother. There is no history of Colon cancer, Colon polyps, Esophageal cancer, Rectal cancer, or Stomach cancer.  ROS:   Please see the history of present illness.    All other systems reviewed and are negative.  EKGs/Labs/Other Studies Reviewed:    The following studies were reviewed today: .SABRAEKG Interpretation Date/Time:  Friday May 21 2023 08:34:54 EST Ventricular Rate:  62 PR Interval:  186 QRS Duration:  96 QT Interval:  416 QTC Calculation: 422 R Axis:   -19  Text Interpretation: Normal sinus rhythm Minimal voltage criteria for LVH, may be normal variant Cannot rule out Anterior infarct , age undetermined Abnormal ECG When compared with ECG of 23-Jan-2019 10:24, PREVIOUS ECG IS PRESENT Confirmed by Edwyna Backers [phone]) on 05/21/2023 9:13:21 AM     Recent Labs: 05/19/2023: ALT 16; BUN 16; Creatinine, Ser 0.98; Hemoglobin 15.0; Platelets 181; Potassium 4.6; Sodium 141; TSH 1.860  Recent Lipid Panel    Component Value Date/Time   CHOL 128 05/19/2023 0816   TRIG 95 05/19/2023 0816   HDL 48 05/19/2023 0816   CHOLHDL 2.7 05/19/2023  0816   LDLCALC 62 05/19/2023 0816    Physical Exam:    VS:  BP 126/62   Pulse 62   Ht 5' 10 (1.778 m)   Wt 196 lb 12.8 oz (89.3 kg)   SpO2 95%   BMI 28.24 kg/m     Wt Readings from Last 3 Encounters:  05/21/23 196 lb 12.8 oz (89.3 kg)  11/18/22 193 lb 9.6 oz (87.8 kg)  10/28/22 194 lb (88 kg)     GEN: Patient is in no acute distress HEENT: Normal NECK: No JVD; No carotid bruits LYMPHATICS: No lymphadenopathy CARDIAC: Hear sounds regular, 2/6 systolic murmur at the apex. RESPIRATORY:  Clear to auscultation without rales, wheezing or rhonchi  ABDOMEN: Soft, non-tender, non-distended MUSCULOSKELETAL:  No edema; No deformity  SKIN: Warm and dry NEUROLOGIC:  Alert and oriented x 3 PSYCHIATRIC:  Normal affect   Signed, Backers JONELLE Edwyna, MD  05/21/2023 9:07 AM    [facility]

## 2023-05-21 NOTE — Patient Instructions (Signed)

## 2023-05-23 ENCOUNTER — Ambulatory Visit (HOSPITAL_BASED_OUTPATIENT_CLINIC_OR_DEPARTMENT_OTHER)
Admission: EM | Admit: 2023-05-23 | Discharge: 2023-05-23 | Disposition: A | Discharge status: Home / Self Care | Payer: PPO | Attending: Family Medicine | Admitting: Family Medicine

## 2023-05-23 ENCOUNTER — Encounter (HOSPITAL_BASED_OUTPATIENT_CLINIC_OR_DEPARTMENT_OTHER): Payer: Self-pay | Admitting: Emergency Medicine

## 2023-05-23 DIAGNOSIS — J208 Acute bronchitis due to other specified organisms: Secondary | ICD-10-CM

## 2023-05-23 DIAGNOSIS — R051 Acute cough: Secondary | ICD-10-CM

## 2023-05-23 MED ORDER — ALBUTEROL SULFATE HFA 108 (90 BASE) MCG/ACT IN AERS: 2.0000 | INHALATION_SPRAY | RESPIRATORY_TRACT | 0 refills | Status: DC | PRN

## 2023-05-23 MED ORDER — PROMETHAZINE-DM 6.25-15 MG/5ML PO SYRP: 5.0000 mL | ORAL_SOLUTION | Freq: Four times a day (QID) | ORAL | 0 refills | Status: DC | PRN

## 2023-05-23 MED ORDER — COMPACT SPACE CHAMBER DEVI: 0 refills | Status: DC

## 2023-05-23 MED ORDER — TRIAMCINOLONE ACETONIDE 40 MG/ML IJ SUSP
40.0000 mg | Freq: Once | INTRAMUSCULAR | Status: AC
  Administered 2023-05-23: 40 mg via INTRAMUSCULAR

## 2023-05-23 NOTE — ED Provider Notes (Signed)
 [facility]    CSN: [id number] Arrival date & time: 05/23/23  0915      History   Chief Complaint Chief Complaint  Patient presents with   Cough   chest congestion    HPI Louis Carrillo is a 78 y.o. male.   Here with complaint of cough, congestion, body aches since Wednesday, 03/18/2024.  He is not clear that he has had a fever, but his body is just aching all over and he feels miserable.  Cough is his main symptom.  He is not really coughing anything up.  He is not sure if he is wheezing or not.   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   Coronary arteriosclerosis 09/25/2020   Encounter for fitting and adjustment of hearing aid 04/09/2021   Encounter for immunization 09/25/2020   Essential (primary) hypertension 10/10/2019   Ex-smoker 10/10/2019   Foot pain 09/25/2020   Headache 09/25/2020   Hearing loss 09/25/2020   History of bladder cancer 04/10/2020   Hyperlipidemia    Hypertrophy of prostate without urinary obstruction and other lower urinary tract symptoms (LUTS) 09/25/2020   Mixed dyslipidemia 11/09/2019   Peptic ulcer 09/25/2020   Post-traumatic stress disorder, chronic 11/04/2022   Tobacco use disorder 09/25/2020    Patient Active Problem List   Diagnosis Date Noted   Post-traumatic stress disorder, chronic 11/04/2022   Encounter for fitting and adjustment of hearing aid 04/09/2021   Encounter for immunization 09/25/2020   Foot  pain 09/25/2020   Headache 09/25/2020   Hearing loss 09/25/2020   Peptic ulcer 09/25/2020   Benign prostatic hyperplasia 09/25/2020   Hypertrophy of prostate without urinary obstruction and other lower urinary tract symptoms (LUTS) 09/25/2020   Coronary arteriosclerosis 09/25/2020   Benign hypertension 09/25/2020   Tobacco use disorder 09/25/2020   Hyperlipidemia    BPH without obstruction/lower urinary tract symptoms 04/10/2020   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   History of bladder cancer 04/10/2020   Cancer (HCC)    CAD (coronary artery disease) 12/19/2019   Abnormal nuclear stress test 11/09/2019   Mixed dyslipidemia 11/09/2019   Chest tightness 10/10/2019   Essential (primary) hypertension 10/10/2019   Ex-smoker 10/10/2019    Past Surgical History:  Procedure Laterality Date   BLADDER TUMOR EXCISION  2014   x3   COLONOSCOPY  12/24/2014   Gupta-polyps   INGUINAL HERNIA REPAIR  age 25   pt unsure what surgery   POLYPECTOMY     WOUND DEBRIDEMENT  1969       Home Medications    Prior to Admission medications   Medication Sig Start Date End Date Taking? Authorizing Provider  albuterol  (VENTOLIN  HFA) 108 (90 Base) MCG/ACT inhaler Inhale 2 puffs into the lungs every 4 (four) hours as needed for wheezing or shortness of breath. 05/23/23  Yes Ival Domino, FNP  aspirin  EC 81 MG tablet Take 81 mg by mouth every Monday, Wednesday, and Friday. Swallow whole.   Yes [provider]  Evolocumab  (REPATHA  SURECLICK) 140 MG/ML SOAJ Inject 140 mg into the skin every 14 (fourteen) days. 11/05/22  Yes Revankar, Jennifer SAUNDERS, MD  losartan -hydrochlorothiazide (HYZAAR) 50-12.5 MG tablet Take 1 tablet by mouth daily.   Yes [provider]  promethazine -dextromethorphan (PROMETHAZINE -DM) 6.25-15 MG/5ML syrup Take 5 mLs by mouth 4 (four) times daily as needed for cough. 05/23/23  Yes Ival Domino, FNP  Spacer/Aero-Holding  Chambers (COMPACT SPACE CHAMBER) DEVI Use with inhaler 05/23/23  Yes Ival Domino, FNP  nitroGLYCERIN  (NITROSTAT ) 0.4 MG SL tablet Place 1 tablet (0.4 mg total) under the tongue every 5 (five) minutes as needed for chest pain. 04/06/22   Revankar, Jennifer SAUNDERS, MD    Family History Family History  Problem Relation Age of Onset   Hypertension Mother    Hypertension Father    Hypertension Brother    Heart attack Maternal Grandmother    Alcohol abuse Paternal Grandfather    Colon cancer Neg Hx    Colon polyps Neg Hx    Esophageal cancer Neg Hx    Rectal cancer Neg Hx    Stomach cancer Neg Hx     Social History Social History   Tobacco Use   Smoking status: Former    Current packs/day: 0.00    Types: Cigarettes    Quit date: 07/27/1998    Years since quitting: 24.8   Smokeless tobacco: Never  Vaping Use   Vaping status: Never Used  Substance Use Topics   Alcohol use: Yes    Alcohol/week: 15.0 standard drinks of alcohol    Types: 15 Cans of beer per week   Drug use: No     Allergies   Bempedoic acid-ezetimibe , Atorvastatin, Citalopram, Omeprazole, and Ranitidine   Review of Systems Review of Systems  Constitutional:  Negative for chills and fever.  HENT:  Positive for congestion. Negative for ear pain and sore throat.   Eyes:  Negative for pain and visual disturbance.  Respiratory:  Positive for cough. Negative for chest tightness and shortness of breath.   Cardiovascular:  Negative for chest pain and palpitations.  Gastrointestinal:  Negative for abdominal pain, constipation, diarrhea, nausea and vomiting.  Genitourinary:  Negative for dysuria and hematuria.  Musculoskeletal:  Positive for arthralgias. Negative for back pain.  Skin:  Negative for color change and rash.  Neurological:  Negative for seizures and syncope.  All other systems reviewed and are negative.    Physical Exam Triage Vital Signs ED Triage Vitals  Encounter Vitals Group     BP 05/23/23 1130  134/83     Systolic BP Percentile --      Diastolic BP Percentile --      Pulse Rate 05/23/23 1130 76     Resp 05/23/23 1130 18     Temp 05/23/23 1130 98.3 F (36.8 C)     Temp Source 05/23/23 1130 Oral     SpO2 05/23/23 1130 96 %     Weight --      Height --      Head Circumference --      Peak Flow --      Pain Score 05/23/23 1128 0     Pain Loc --      Pain Education --      Exclude from Growth Chart --    No data found.  Updated Vital Signs BP 134/83 (BP Location: Right Arm)   Pulse 76   Temp 98.3 F (36.8 C) (Oral)   Resp 18   SpO2  96%   Visual Acuity Right Eye Distance:   Left Eye Distance:   Bilateral Distance:    Right Eye Near:   Left Eye Near:    Bilateral Near:     Physical Exam Vitals and nursing note reviewed.  Constitutional:      General: He is not in acute distress.    Appearance: He is well-developed.  HENT:     Head: Normocephalic and atraumatic.     Right Ear: Hearing, tympanic membrane, ear canal and external ear normal.     Left Ear: Hearing, tympanic membrane, ear canal and external ear normal.     Nose: Congestion and rhinorrhea present. Rhinorrhea is clear.     Mouth/Throat:     Lips: Pink.     Mouth: Mucous membranes are moist.     Pharynx: Uvula midline. No oropharyngeal exudate or posterior oropharyngeal erythema.     Tonsils: No tonsillar exudate.  Eyes:     Conjunctiva/sclera: Conjunctivae normal.     Pupils: Pupils are equal, round, and reactive to light.  Cardiovascular:     Rate and Rhythm: Normal rate and regular rhythm.     Heart sounds: Normal heart sounds, S1 normal and S2 normal. No murmur heard. Pulmonary:     Effort: Pulmonary effort is normal. No respiratory distress.     Breath sounds: Normal breath sounds.  Abdominal:     Palpations: Abdomen is soft.     Tenderness: There is no abdominal tenderness.  Musculoskeletal:        General: No swelling.     Cervical back: Neck supple.  Lymphadenopathy:     Head:      Right side of head: No submental, submandibular, tonsillar, preauricular or posterior auricular adenopathy.     Left side of head: No submental, submandibular, tonsillar, preauricular or posterior auricular adenopathy.     Cervical: No cervical adenopathy.     Right cervical: No superficial cervical adenopathy.    Left cervical: No superficial cervical adenopathy.  Skin:    General: Skin is warm and dry.     Capillary Refill: Capillary refill takes less than 2 seconds.  Neurological:     Mental Status: He is alert and oriented to person, place, and time.  Psychiatric:        Mood and Affect: Mood normal.      UC Treatments / Results  Labs (all labs ordered are listed, but only abnormal results are displayed) Labs Reviewed - No data to display  EKG   Radiology No results found.  Procedures Procedures (including critical care time)  Medications Ordered in UC Medications  triamcinolone  acetonide (KENALOG -40) injection 40 mg (40 mg Intramuscular Given 05/23/23 1240)    Initial Impression / Assessment and Plan / UC Course  I have reviewed the triage vital signs and the nursing notes.  Pertinent labs & imaging results that were available during my care of the patient were reviewed by me and considered in my medical decision making (see chart for details).  Exam is most consistent with viral bronchitis with secondary persistent cough.  Kenalog  40 mg IM now.  Promethazine  DM, 5 mL, every 6 hours, as needed for cough.  He has minimal wheezing and is not very interested in inhaler.  The Kenalog  should be very helpful.  Provided prescription for the inhaler if he should choose to get it.  Albuterol  inhaler, 2 puffs, every 4 hours, as needed for wheezing.  Use albuterol  inhaler with a spacer.  Return here  or see family doctor or go to an emergency room if symptoms do not improve, worsen or new symptoms occur.  Advised that if he becomes concerned about pneumonia he should go to a  facility that has x-ray as we do not have her x-ray yet. Final Clinical Impressions(s) / UC Diagnoses   Final diagnoses:  Acute viral bronchitis  Acute cough     Discharge Instructions      Educated about viral bronchitis and that antibiotics will not help.  He has mild wheezing on exam today.  Will treat with Kenalog , 40 mg injection in the office today.  Promethazine  DM, 1 teaspoon, every 6 hours, as needed for cough.  The Promethazine  DM may make you sleepy do not use and drive.  Get plenty of fluids and rest.  Return here or to an emergency room if symptoms do not improve, worsen or new symptoms occur.  Advised that if he becomes concerned about pneumonia, should go to a facility that has x-ray as we do not have x-ray at this moment.     ED Prescriptions     Medication Sig Dispense Auth. Provider   promethazine -dextromethorphan (PROMETHAZINE -DM) 6.25-15 MG/5ML syrup Take 5 mLs by mouth 4 (four) times daily as needed for cough. 118 mL Ival Domino, FNP   albuterol  (VENTOLIN  HFA) 108 (90 Base) MCG/ACT inhaler Inhale 2 puffs into the lungs every 4 (four) hours as needed for wheezing or shortness of breath. 1 each Ival Domino, FNP   Spacer/Aero-Holding Chambers (COMPACT SPACE CHAMBER) DEVI Use with inhaler 1 each Ival Domino, FNP      PDMP not reviewed this encounter.   Ival Domino, FNP 05/23/23 1244

## 2023-05-23 NOTE — Discharge Instructions (Addendum)
 Educated about viral bronchitis and that antibiotics will not help.  He has mild wheezing on exam today.  Will treat with Kenalog , 40 mg injection in the office today.  Promethazine  DM, 1 teaspoon, every 6 hours, as needed for cough.  The Promethazine  DM may make you sleepy do not use and drive.  Get plenty of fluids and rest.  Return here or to an emergency room if symptoms do not improve, worsen or new symptoms occur.  Advised that if he becomes concerned about pneumonia, should go to a facility that has x-ray as we do not have x-ray at this moment.

## 2023-05-23 NOTE — ED Triage Notes (Signed)
 Pt c/o coughing, chest congestion, fatigue x 4 days ago.

## 2023-05-30 DIAGNOSIS — H5711 Ocular pain, right eye: Secondary | ICD-10-CM | POA: Diagnosis not present

## 2023-06-04 DIAGNOSIS — H26493 Other secondary cataract, bilateral: Secondary | ICD-10-CM | POA: Diagnosis not present

## 2023-10-20 ENCOUNTER — Telehealth: Payer: Self-pay | Admitting: Cardiology

## 2023-10-20 MED ORDER — NITROGLYCERIN 0.4 MG SL SUBL: 0.4000 mg | SUBLINGUAL_TABLET | SUBLINGUAL | 5 refills | Status: AC | PRN

## 2023-10-20 NOTE — Telephone Encounter (Signed)
 Pt's medication was sent to pt's pharmacy as requested. Confirmation received.

## 2023-10-20 NOTE — Telephone Encounter (Signed)
 Patient needs refill on nitroglycerin  sent to [facility] pharmacy on [address]

## 2023-10-25 ENCOUNTER — Other Ambulatory Visit: Payer: Self-pay | Admitting: Cardiology

## 2023-11-10 DIAGNOSIS — Z85828 Personal history of other malignant neoplasm of skin: Secondary | ICD-10-CM | POA: Diagnosis not present

## 2023-11-10 DIAGNOSIS — L570 Actinic keratosis: Secondary | ICD-10-CM | POA: Diagnosis not present

## 2023-11-10 DIAGNOSIS — L500 Allergic urticaria: Secondary | ICD-10-CM | POA: Diagnosis not present

## 2024-02-02 DIAGNOSIS — R051 Acute cough: Secondary | ICD-10-CM | POA: Diagnosis not present

## 2024-02-02 DIAGNOSIS — R6883 Chills (without fever): Secondary | ICD-10-CM | POA: Diagnosis not present

## 2024-02-14 ENCOUNTER — Other Ambulatory Visit: Payer: Self-pay

## 2024-02-15 ENCOUNTER — Encounter: Payer: Self-pay | Admitting: Cardiology

## 2024-02-15 ENCOUNTER — Ambulatory Visit: Attending: Cardiology | Admitting: Cardiology

## 2024-02-15 VITALS — BP 128/76 | HR 58 | Ht 70.0 in | Wt 191.2 lb

## 2024-02-15 DIAGNOSIS — Z1321 Encounter for screening for nutritional disorder: Secondary | ICD-10-CM

## 2024-02-15 DIAGNOSIS — I2510 Atherosclerotic heart disease of native coronary artery without angina pectoris: Secondary | ICD-10-CM

## 2024-02-15 DIAGNOSIS — Z131 Encounter for screening for diabetes mellitus: Secondary | ICD-10-CM

## 2024-02-15 DIAGNOSIS — E782 Mixed hyperlipidemia: Secondary | ICD-10-CM

## 2024-02-15 DIAGNOSIS — I10 Essential (primary) hypertension: Secondary | ICD-10-CM

## 2024-02-15 NOTE — Progress Notes (Signed)
 Cardiology Office Note:    Date:  02/15/2024   ID:  [name], DOB 1945-07-27, MRN [id number]  PCP:  Benjamine Lauraine DASEN, NP  Cardiologist:  Jennifer JONELLE Crape, MD   Referring MD: Benjamine Lauraine DASEN, NP    ASSESSMENT:    1. Coronary artery disease involving native coronary artery of native heart without angina pectoris   2. Benign hypertension   3. Essential (primary) hypertension   4. Mixed dyslipidemia    PLAN:    In order of problems listed above:  Coronary artery disease: Secondary prevention stressed with the patient.  Importance of compliance with diet medication stressed and patient verbalized standing. He was advised to walk at least half an hour a day on a daily basis and he promises to do so. Essential hypertension: Blood pressure is stable and diet was emphasized. Mixed dyslipidemia: On lipid-lowering medications followed by us .  Will have complete blood work today.  Will also do hemoglobin A1c and vitamin D  in view of history of deficiency Patient will be seen in follow-up appointment in 6 months or earlier if the patient has any concerns.    Medication Adjustments/Labs and Tests Ordered: Current medicines are reviewed at length with the patient today.  Concerns regarding medicines are outlined above.  No orders of the defined types were placed in this encounter.  No orders of the defined types were placed in this encounter.    No chief complaint on file.    History of Present Illness:    Louis Carrillo is a 78 y.o. male.  Patient has past medical history of coronary artery disease, essential hypertension, mixed dyslipidemia and he denies any problems at this time and takes care of activities of daily living.  No chest pain orthopnea or PND.  At the time of my evaluation, the patient is alert awake oriented and in no distress.  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   Coronary arteriosclerosis 09/25/2020   Encounter for fitting and adjustment of hearing aid 04/09/2021   Encounter for immunization 09/25/2020   Essential (primary) hypertension 10/10/2019   Ex-smoker 10/10/2019   Foot pain 09/25/2020   Headache 09/25/2020   Hearing loss 09/25/2020   History of bladder cancer 04/10/2020   Hyperlipidemia    Hypertrophy of prostate without urinary obstruction and other lower urinary tract symptoms (LUTS) 09/25/2020   Mixed dyslipidemia 11/09/2019   Peptic ulcer 09/25/2020   Post-traumatic stress disorder, chronic 11/04/2022   Tobacco use disorder 09/25/2020    Past Surgical History:  Procedure Laterality Date   BLADDER TUMOR EXCISION  2014   x3   COLONOSCOPY  12/24/2014   Gupta-polyps   INGUINAL HERNIA REPAIR  age 6   pt unsure what surgery   POLYPECTOMY     WOUND DEBRIDEMENT  1969    Current Medications: Current Meds  Medication Sig   aspirin  EC 81 MG tablet Take 81 mg by mouth every Monday, Wednesday, and Friday. Swallow whole.   Evolocumab  (REPATHA  SURECLICK) 140 MG/ML SOAJ INJECT 140 MG INTO THE SKIN EVERY 14 DAYS   losartan -hydrochlorothiazide (HYZAAR) 50-12.5 MG tablet Take 1 tablet by mouth daily.   nitroGLYCERIN  (  NITROSTAT ) 0.4 MG SL tablet Place 1 tablet (0.4 mg total) under the tongue every 5 (five) minutes as needed for chest pain.     Allergies:   Bempedoic acid-ezetimibe , Atorvastatin, Citalopram, Omeprazole, and Ranitidine   Social History   Socioeconomic History   Marital status: Married    Spouse name: Not on file   Number of children: Not on file   Years of education: Not on file   Highest education level: Not on  file  Occupational History   Not on file  Tobacco Use   Smoking status: Former    Current packs/day: 0.00    Types: Cigarettes    Quit date: 07/27/1998    Years since quitting: 25.5   Smokeless tobacco: Never  Vaping Use   Vaping status: Never Used  Substance and Sexual Activity   Alcohol use: Yes    Alcohol/week: 15.0 standard drinks of alcohol    Types: 15 Cans of beer per week   Drug use: No   Sexual activity: Not on file  Other Topics Concern   Not on file  Social History Narrative   Not on file   Social Drivers of Health   Financial Resource Strain: Not on file  Food Insecurity: Not on file  Transportation Needs: Not on file  Physical Activity: Not on file  Stress: Not on file  Social Connections: Not on file     Family History: The patient's family history includes Alcohol abuse in his paternal grandfather; Heart attack in his maternal grandmother; Hypertension in his brother, father, and mother. There is no history of Colon cancer, Colon polyps, Esophageal cancer, Rectal cancer, or Stomach cancer.  ROS:   Please see the history of present illness.    All other systems reviewed and are negative.  EKGs/Labs/Other Studies Reviewed:    The following studies were reviewed today: I discussed my findings with the patient at length   Recent Labs: 05/19/2023: ALT 16; BUN 16; Creatinine, Ser 0.98; Hemoglobin 15.0; Platelets 181; Potassium 4.6; Sodium 141; TSH 1.860  Recent Lipid Panel    Component Value Date/Time   CHOL 128 05/19/2023 0816   TRIG 95 05/19/2023 0816   HDL 48 05/19/2023 0816   CHOLHDL 2.7 05/19/2023 0816   LDLCALC 62 05/19/2023 0816    Physical Exam:    VS:  BP 128/76   Pulse (!) 58   Ht 5' 10 (1.778 m)   Wt 191 lb 3.2 oz (86.7 kg)   SpO2 98%   BMI 27.43 kg/m     Wt Readings from Last 3 Encounters:  02/15/24 191 lb 3.2 oz (86.7 kg)  05/21/23 196 lb 12.8 oz (89.3 kg)  11/18/22 193 lb 9.6 oz (87.8 kg)     GEN: Patient is in no acute  distress HEENT: Normal NECK: No JVD; No carotid bruits LYMPHATICS: No lymphadenopathy CARDIAC: Hear sounds regular, 2/6 systolic murmur at the apex. RESPIRATORY:  Clear to auscultation without rales, wheezing or rhonchi  ABDOMEN: Soft, non-tender, non-distended MUSCULOSKELETAL:  No edema; No deformity  SKIN: Warm and dry NEUROLOGIC:  Alert and oriented x 3 PSYCHIATRIC:  Normal affect   Signed, Jennifer JONELLE Crape, MD  02/15/2024 8:26 AM    [facility]

## 2024-02-15 NOTE — Patient Instructions (Signed)
 Medication Instructions:  Your physician recommends that you continue on your current medications as directed. Please refer to the Current Medication list given to you today.  *If you need a refill on your cardiac medications before your next appointment, please call your pharmacy*   Lab Work: Your physician recommends that you have a CMP, CBC, TSH, vitamin D , A1C and fasting lipids today in the office.  If you have labs (blood work) drawn today and your tests are completely normal, you will receive your results only by: MyChart Message (if you have MyChart) OR A paper copy in the mail If you have any lab test that is abnormal or we need to change your treatment, we will call you to review the results.   Testing/Procedures: None ordered   Follow-Up: At [facility], you and your health needs are our priority.  As part of our continuing mission to provide you with exceptional heart care, we have created designated Provider Care Teams.  These Care Teams include your primary Cardiologist (physician) and Advanced Practice Providers (APPs -  Physician Assistants and Nurse Practitioners) who all work together to provide you with the care you need, when you need it.  We recommend signing up for the patient portal called MyChart.  Sign up information is provided on this After Visit Summary.  MyChart is used to connect with patients for Virtual Visits (Telemedicine).  Patients are able to view lab/test results, encounter notes, upcoming appointments, etc.  Non-urgent messages can be sent to your provider as well.   To learn more about what you can do with MyChart, go to [email or web address].    Your next appointment:   9 month(s)  The format for your next appointment:   In Person  Provider:   Jennifer Crape, MD    Other Instructions none  Important Information About Sugar

## 2024-02-16 ENCOUNTER — Ambulatory Visit: Payer: Self-pay | Admitting: Cardiology

## 2024-02-16 LAB — HEMOGLOBIN A1C
Est. average glucose Bld gHb Est-mCnc: 120 mg/dL
Hgb A1c MFr Bld: 5.8 % — ABNORMAL HIGH (ref 4.8–5.6)

## 2024-02-16 LAB — COMPREHENSIVE METABOLIC PANEL WITH GFR
ALT: 14 IU/L (ref 0–44)
AST: 14 IU/L (ref 0–40)
Albumin: 4.1 g/dL (ref 3.8–4.8)
Alkaline Phosphatase: 94 IU/L (ref 47–123)
BUN/Creatinine Ratio: 14 (ref 10–24)
BUN: 16 mg/dL (ref 8–27)
Bilirubin Total: 0.5 mg/dL (ref 0.0–1.2)
CO2: 26 mmol/L (ref 20–29)
Calcium: 9.3 mg/dL (ref 8.6–10.2)
Chloride: 101 mmol/L (ref 96–106)
Creatinine, Ser: 1.15 mg/dL (ref 0.76–1.27)
Globulin, Total: 2.2 g/dL (ref 1.5–4.5)
Glucose: 90 mg/dL (ref 70–99)
Potassium: 4.5 mmol/L (ref 3.5–5.2)
Sodium: 139 mmol/L (ref 134–144)
Total Protein: 6.3 g/dL (ref 6.0–8.5)
eGFR: 66 mL/min/1.73 (ref >59)

## 2024-02-16 LAB — CBC
Hematocrit: 43.2 % (ref 37.5–51.0)
Hemoglobin: 14 g/dL (ref 13.0–17.7)
MCH: 30.4 pg (ref 26.6–33.0)
MCHC: 32.4 g/dL (ref 31.5–35.7)
MCV: 94 fL (ref 79–97)
Platelets: 170 x10E3/uL (ref 150–450)
RBC: 4.61 x10E6/uL (ref 4.14–5.80)
RDW: 13 % (ref 11.6–15.4)
WBC: 4.1 x10E3/uL (ref 3.4–10.8)

## 2024-02-16 LAB — LIPID PANEL
Chol/HDL Ratio: 3.1 ratio (ref 0.0–5.0)
Cholesterol, Total: 122 mg/dL (ref 100–199)
HDL: 40 mg/dL (ref >39)
LDL Chol Calc (NIH): 68 mg/dL (ref 0–99)
Triglycerides: 69 mg/dL (ref 0–149)
VLDL Cholesterol Cal: 14 mg/dL (ref 5–40)

## 2024-02-16 LAB — TSH: TSH: 1.75 u[IU]/mL (ref 0.450–4.500)

## 2024-02-16 LAB — VITAMIN D 25 HYDROXY (VIT D DEFICIENCY, FRACTURES): Vit D, 25-Hydroxy: 38.9 ng/mL (ref 30.0–100.0)

## 2024-04-17 DIAGNOSIS — R0981 Nasal congestion: Secondary | ICD-10-CM | POA: Diagnosis not present

## 2024-04-20 ENCOUNTER — Encounter: Payer: Self-pay | Admitting: Cardiology

## 2024-04-20 ENCOUNTER — Ambulatory Visit: Attending: Cardiology | Admitting: Cardiology

## 2024-04-20 VITALS — BP 116/70 | HR 76 | Ht 70.0 in | Wt 196.2 lb

## 2024-04-20 DIAGNOSIS — I2510 Atherosclerotic heart disease of native coronary artery without angina pectoris: Secondary | ICD-10-CM

## 2024-04-20 DIAGNOSIS — I10 Essential (primary) hypertension: Secondary | ICD-10-CM | POA: Diagnosis not present

## 2024-04-20 DIAGNOSIS — E782 Mixed hyperlipidemia: Secondary | ICD-10-CM

## 2024-04-20 MED ORDER — LOSARTAN POTASSIUM 50 MG PO TABS: 50.0000 mg | ORAL_TABLET | Freq: Every day | ORAL | 3 refills | Status: AC

## 2024-04-20 NOTE — Patient Instructions (Signed)
 Medication Instructions:  Your physician has recommended you make the following change in your medication:   Hold your next dose of Repatha  to see if you notice a change.  Stop Losartan -hydrochlorothiazide  Start Losartan  50 mg daily  *If you need a refill on your cardiac medications before your next appointment, please call your pharmacy*   Lab Work: None ordered If you have labs (blood work) drawn today and your tests are completely normal, you will receive your results only by: MyChart Message (if you have MyChart) OR A paper copy in the mail If you have any lab test that is abnormal or we need to change your treatment, we will call you to review the results.   Testing/Procedures: None ordered   Follow-Up: At [facility], you and your health needs are our priority.  As part of our continuing mission to provide you with exceptional heart care, we have created designated Provider Care Teams.  These Care Teams include your primary Cardiologist (physician) and Advanced Practice Providers (APPs -  Physician Assistants and Nurse Practitioners) who all work together to provide you with the care you need, when you need it.  We recommend signing up for the patient portal called MyChart.  Sign up information is provided on this After Visit Summary.  MyChart is used to connect with patients for Virtual Visits (Telemedicine).  Patients are able to view lab/test results, encounter notes, upcoming appointments, etc.  Non-urgent messages can be sent to your provider as well.   To learn more about what you can do with MyChart, go to [email or web address].    Your next appointment:   6 week(s)  The format for your next appointment:   In Person  Provider:   Jennifer Crape, MD    Other Instructions none  Important Information About Sugar

## 2024-04-20 NOTE — Progress Notes (Signed)
 Cardiology Office Note:    Date:  04/20/2024   ID:  Louis Carrillo, DOB [birth year], MRN [id number]  PCP:  Benjamine Lauraine DASEN, NP  Cardiologist:  Jennifer JONELLE Crape, MD   Referring MD: Benjamine Lauraine DASEN, NP    ASSESSMENT:    1. Benign hypertension   2. Coronary artery disease involving native coronary artery of native heart without angina pectoris   3. Mixed dyslipidemia    PLAN:    In order of problems listed above:  Coronary artery disease: secondary prevention stressed with the patient.  Importance of compliance with diet medication stressed and patient verbalized standing. He was also advised to walk at least half an hour a day on a daily basis. Essential hypertension: Blood pressure is stable and diet was emphasized.  Lifestyle modification is urged.  He gives history of some dizzy spells and so I cut down his losartan  hydrochlorothiazide and we will give him only losartan  for couple of weeks and see how she feels.  He will keep a track of his blood pressures. Mixed dyslipidemia: On lipid-lowering medications but he feels that he is having some side effects like weakness from the injection so I told him to hold 1 injection for now and tell us  how he feels.. Patient will be seen in follow-up appointment in 6 months or earlier if the patient has any concerns.    Medication Adjustments/Labs and Tests Ordered: Current medicines are reviewed at length with the patient today.  Concerns regarding medicines are outlined above.  No orders of the defined types were placed in this encounter.  No orders of the defined types were placed in this encounter.    No chief complaint on file.    History of Present Illness:    Louis Carrillo is a 78 y.o. male.  Patient has past medical history of coronary artery disease, essential hypertension, mixed dyslipidemia.  He denies any problems at this time and takes care of activities of daily living.  No chest pain orthopnea or PND.  At the time of  my evaluation, the patient is alert awake oriented and in no distress.  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   Coronary arteriosclerosis 09/25/2020   Encounter for fitting and adjustment of hearing aid 04/09/2021   Encounter for immunization 09/25/2020   Essential (primary) hypertension 10/10/2019   Ex-smoker 10/10/2019   Foot pain 09/25/2020   Headache 09/25/2020   Hearing loss 09/25/2020   History of bladder cancer 04/10/2020   Hyperlipidemia    Hypertrophy of prostate without urinary obstruction and other lower urinary tract symptoms (LUTS) 09/25/2020   Mixed dyslipidemia 11/09/2019   Peptic ulcer 09/25/2020   Post-traumatic stress disorder, chronic 11/04/2022   Tobacco use disorder 09/25/2020    Past Surgical History:  Procedure Laterality Date   BLADDER TUMOR EXCISION  2014   x3   COLONOSCOPY  12/24/2014   Gupta-polyps   INGUINAL HERNIA REPAIR  age 73   pt unsure what surgery   POLYPECTOMY     WOUND DEBRIDEMENT  1969    Current Medications: Active Medications[1]   Allergies:   Bempedoic acid-ezetimibe , Atorvastatin, Citalopram, Omeprazole, and Ranitidine  Social History   Socioeconomic History   Marital status: Married    Spouse name: Not on file   Number of children: Not on file   Years of education: Not on file   Highest education level: Not on file  Occupational History   Not on file  Tobacco Use   Smoking status: Former    Current packs/day: 0.00    Types: Cigarettes    Quit date: 07/27/1998    Years since quitting: 25.7   Smokeless tobacco: Never  Vaping Use    Vaping status: Never Used  Substance and Sexual Activity   Alcohol use: Yes    Alcohol/week: 15.0 standard drinks of alcohol    Types: 15 Cans of beer per week   Drug use: No   Sexual activity: Not on file  Other Topics Concern   Not on file  Social History Narrative   Not on file   Social Drivers of Health   Tobacco Use: Medium Risk (04/20/2024)   Patient History    Smoking Tobacco Use: Former    Smokeless Tobacco Use: Never    Passive Exposure: Not on Actuary Strain: Not on file  Food Insecurity: Not on file  Transportation Needs: Not on file  Physical Activity: Not on file  Stress: Not on file  Social Connections: Not on file  Depression (EYV7-0): Not on file  Alcohol Screen: Not on file  Housing: Not on file  Utilities: Not on file  Health Literacy: Not on file     Family History: The patient's family history includes Alcohol abuse in his paternal grandfather; Heart attack in his maternal grandmother; Hypertension in his brother, father, and mother. There is no history of Colon cancer, Colon polyps, Esophageal cancer, Rectal cancer, or Stomach cancer.  ROS:   Please see the history of present illness.    All other systems reviewed and are negative.  EKGs/Labs/Other Studies Reviewed:    The following studies were reviewed today: .SABRA   I discussed my findings with the patient at length   Recent Labs: 02/15/2024: ALT 14; BUN 16; Creatinine, Ser 1.15; Hemoglobin 14.0; Platelets 170; Potassium 4.5; Sodium 139; TSH 1.750  Recent Lipid Panel    Component Value Date/Time   CHOL 122 02/15/2024 0858   TRIG 69 02/15/2024 0858   HDL 40 02/15/2024 0858   CHOLHDL 3.1 02/15/2024 0858   LDLCALC 68 02/15/2024 0858    Physical Exam:    VS:  BP 116/70   Pulse 76   Ht 5' 10 (1.778 m)   Wt 196 lb 4 oz (89 kg)   SpO2 97%   BMI 28.16 kg/m     Wt Readings from Last 3 Encounters:  04/20/24 196 lb 4 oz (89 kg)  02/15/24 191 lb 3.2 oz (86.7 kg)   05/21/23 196 lb 12.8 oz (89.3 kg)     GEN: Patient is in no acute distress HEENT: Normal NECK: No JVD; No carotid bruits LYMPHATICS: No lymphadenopathy CARDIAC: Hear sounds regular, 2/6 systolic murmur at the apex. RESPIRATORY:  Clear to auscultation without rales, wheezing or rhonchi  ABDOMEN: Soft, non-tender, non-distended MUSCULOSKELETAL:  No edema; No deformity  SKIN: Warm and dry NEUROLOGIC:  Alert and oriented x 3 PSYCHIATRIC:  Normal affect   Signed, Jennifer JONELLE Crape, MD  04/20/2024 9:37 AM    [facility]     [1]  Current Meds  Medication Sig   aspirin  EC 81 MG tablet Take 81 mg by  mouth every Monday, Wednesday, and Friday. Swallow whole.   Evolocumab  (REPATHA  SURECLICK) 140 MG/ML SOAJ INJECT 140 MG INTO THE SKIN EVERY 14 DAYS   losartan -hydrochlorothiazide (HYZAAR) 50-12.5 MG tablet Take 1 tablet by mouth daily.   nitroGLYCERIN  (NITROSTAT ) 0.4 MG SL tablet Place 1 tablet (0.4 mg total) under the tongue every 5 (five) minutes as needed for chest pain.

## 2024-06-07 ENCOUNTER — Encounter: Payer: Self-pay | Admitting: Cardiology

## 2024-06-07 ENCOUNTER — Ambulatory Visit: Attending: Cardiology | Admitting: Cardiology

## 2024-06-07 VITALS — BP 140/72 | HR 55 | Ht 70.0 in | Wt 197.5 lb

## 2024-06-07 DIAGNOSIS — Z131 Encounter for screening for diabetes mellitus: Secondary | ICD-10-CM | POA: Diagnosis not present

## 2024-06-07 DIAGNOSIS — Z1321 Encounter for screening for nutritional disorder: Secondary | ICD-10-CM | POA: Diagnosis not present

## 2024-06-07 DIAGNOSIS — E782 Mixed hyperlipidemia: Secondary | ICD-10-CM | POA: Diagnosis not present

## 2024-06-07 DIAGNOSIS — I2510 Atherosclerotic heart disease of native coronary artery without angina pectoris: Secondary | ICD-10-CM

## 2024-06-07 DIAGNOSIS — I10 Essential (primary) hypertension: Secondary | ICD-10-CM

## 2024-06-07 NOTE — Patient Instructions (Signed)
 Medication Instructions:  Your physician recommends that you continue on your current medications as directed. Please refer to the Current Medication list given to you today.  *If you need a refill on your cardiac medications before your next appointment, please call your pharmacy*   Lab Work: Your physician recommends that you return for lab work in: 2 months for CMP, CBC, TSH, vitamin d , A1C and fasting lipids. You need to have labs done when you are fasting.  You can come Monday through Friday 8:30 am to 12:00 pm and 1:15 to 4:30. You do not need to make an appointment as the order has already been placed.   If you have labs (blood work) drawn today and your tests are completely normal, you will receive your results only by: MyChart Message (if you have MyChart) OR A paper copy in the mail If you have any lab test that is abnormal or we need to change your treatment, we will call you to review the results.   Testing/Procedures: None ordered   Follow-Up: At [facility], you and your health needs are our priority.  As part of our continuing mission to provide you with exceptional heart care, we have created designated Provider Care Teams.  These Care Teams include your primary Cardiologist (physician) and Advanced Practice Providers (APPs -  Physician Assistants and Nurse Practitioners) who all work together to provide you with the care you need, when you need it.  We recommend signing up for the patient portal called MyChart.  Sign up information is provided on this After Visit Summary.  MyChart is used to connect with patients for Virtual Visits (Telemedicine).  Patients are able to view lab/test results, encounter notes, upcoming appointments, etc.  Non-urgent messages can be sent to your provider as well.   To learn more about what you can do with MyChart, go to [email or web address].    Your next appointment:   9 month(s)  The format for your next appointment:   In  Person  Provider:   Jennifer Crape, MD    Other Instructions none  Important Information About Sugar

## 2024-06-07 NOTE — Progress Notes (Signed)
 " Cardiology Office Note:    Date:  06/07/2024   ID:  Donevan Biller, DOB March 10, 1946, MRN [id number]  PCP:  Benjamine Lauraine DASEN, NP  Cardiologist:  Jennifer JONELLE Crape, MD   Referring MD: Benjamine Lauraine DASEN, NP    ASSESSMENT:    1. Mixed dyslipidemia   2. Coronary artery disease involving native coronary artery of native heart without angina pectoris   3. Benign hypertension    PLAN:    In order of problems listed above:  Coronary artery disease: Secondary prevention stressed with the patient.  Importance of compliance with diet medication stressed and patient verbalized standing.  He was advised to walk at least half an hour a day on a daily basis. Essential hypertension: Blood pressure stable and diet was emphasized.  Lifestyle modification urged.  He is asymptomatic from postural dizziness and said symptoms. Mixed dyslipidemia: On lipid-lowering medications.  He is using Repatha  but did not put it on hold he will restart it again and come back in 2 months for complete blood work.  He requests A1c and vitamin D  for history of deficiency and we will oblige. Patient will be seen in follow-up appointment in 6 months or earlier if the patient has any concerns.    Medication Adjustments/Labs and Tests Ordered: Current medicines are reviewed at length with the patient today.  Concerns regarding medicines are outlined above.  Orders Placed This Encounter  Procedures   EKG 12-Lead   No orders of the defined types were placed in this encounter.    No chief complaint on file.    History of Present Illness:    Louis Carrillo is a 79 y.o. male.  Patient has past medical history of coronary artery disease, essential hypertension and mixed dyslipidemia.  He denies any problems at this time and takes care of activities of daily living.  No chest pain orthopnea or PND.  He ambulates regularly but does not exercise.  We cut down his blood pressure medicines and his dizzy spells have gotten  better.  At the time of my evaluation, the patient is alert awake oriented and in no distress.  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   Coronary arteriosclerosis 09/25/2020   Encounter for fitting and adjustment of hearing aid 04/09/2021   Encounter for immunization 09/25/2020   Essential (primary) hypertension 10/10/2019   Ex-smoker 10/10/2019   Foot pain 09/25/2020   Headache 09/25/2020   Hearing loss 09/25/2020   History of bladder cancer 04/10/2020   Hyperlipidemia    Hypertrophy of prostate without urinary obstruction and other lower urinary tract symptoms (LUTS) 09/25/2020   Mixed dyslipidemia 11/09/2019   Peptic ulcer 09/25/2020   Post-traumatic stress disorder, chronic 11/04/2022   Tobacco use disorder 09/25/2020    Past Surgical History:  Procedure Laterality Date   BLADDER TUMOR EXCISION  2014   x3   COLONOSCOPY  12/24/2014   Gupta-polyps   INGUINAL HERNIA REPAIR  age 4   pt unsure what surgery   POLYPECTOMY     WOUND DEBRIDEMENT  1969    Current Medications: Active Medications[1]   Allergies:   Bempedoic acid-ezetimibe , Atorvastatin, Citalopram, Omeprazole, and Ranitidine   Social History  Socioeconomic History   Marital status: Married    Spouse name: Not on file   Number of children: Not on file   Years of education: Not on file   Highest education level: Not on file  Occupational History   Not on file  Tobacco Use   Smoking status: Former    Current packs/day: 0.00    Types: Cigarettes    Quit date: 07/27/1998    Years since quitting: 25.8   Smokeless tobacco: Never  Vaping Use   Vaping status:  Never Used  Substance and Sexual Activity   Alcohol use: Yes    Alcohol/week: 15.0 standard drinks of alcohol    Types: 15 Cans of beer per week   Drug use: No   Sexual activity: Not on file  Other Topics Concern   Not on file  Social History Narrative   Not on file   Social Drivers of Health   Tobacco Use: Medium Risk (06/07/2024)   Patient History    Smoking Tobacco Use: Former    Smokeless Tobacco Use: Never    Passive Exposure: Not on Actuary Strain: Not on file  Food Insecurity: Not on file  Transportation Needs: Not on file  Physical Activity: Not on file  Stress: Not on file  Social Connections: Not on file  Depression (EYV7-0): Not on file  Alcohol Screen: Not on file  Housing: Not on file  Utilities: Not on file  Health Literacy: Not on file     Family History: The patient's family history includes Alcohol abuse in his paternal grandfather; Heart attack in his maternal grandmother; Hypertension in his brother, father, and mother. There is no history of Colon cancer, Colon polyps, Esophageal cancer, Rectal cancer, or Stomach cancer.  ROS:   Please see the history of present illness.    All other systems reviewed and are negative.  EKGs/Labs/Other Studies Reviewed:    The following studies were reviewed today: .SABRAEKG Interpretation Date/Time:  Wednesday June 07 2024 08:31:02 EST Ventricular Rate:  55 PR Interval:  196 QRS Duration:  98 QT Interval:  434 QTC Calculation: 415 R Axis:   -13  Text Interpretation: Sinus bradycardia Inferior infarct , age undetermined Abnormal ECG When compared with ECG of 21-May-2023 08:34, Minimal criteria for Anterior infarct are no longer Present No significant change was found Confirmed by Edwyna Backers [phone]) on 06/07/2024 8:43:40 AM     Recent Labs: 02/15/2024: ALT 14; BUN 16; Creatinine, Ser 1.15; Hemoglobin 14.0; Platelets 170; Potassium 4.5; Sodium 139; TSH 1.750  Recent Lipid Panel    Component  Value Date/Time   CHOL 122 02/15/2024 0858   TRIG 69 02/15/2024 0858   HDL 40 02/15/2024 0858   CHOLHDL 3.1 02/15/2024 0858   LDLCALC 68 02/15/2024 0858    Physical Exam:    VS:  BP (!) 140/72   Pulse (!) 55   Ht 5' 10 (1.778 m)   Wt 197 lb 8 oz (89.6 kg)   SpO2 97%   BMI 28.34 kg/m     Wt Readings from Last 3 Encounters:  06/07/24 197 lb 8 oz (89.6 kg)  04/20/24 196 lb 4 oz (89 kg)  02/15/24 191 lb 3.2 oz (86.7 kg)     GEN: Patient is in no acute distress HEENT: Normal NECK: No JVD; No carotid bruits LYMPHATICS: No lymphadenopathy CARDIAC: Hear sounds regular, 2/6 systolic murmur at the apex. RESPIRATORY:  Clear to auscultation without rales, wheezing or rhonchi  ABDOMEN: Soft, non-tender,  non-distended MUSCULOSKELETAL:  No edema; No deformity  SKIN: Warm and dry NEUROLOGIC:  Alert and oriented x 3 PSYCHIATRIC:  Normal affect   Signed, Jennifer JONELLE Crape, MD  06/07/2024 8:45 AM    [facility]     [1]  Current Meds  Medication Sig   aspirin  EC 81 MG tablet Take 81 mg by mouth every Monday, Wednesday, and Friday. Swallow whole.   losartan  (COZAAR ) 50 MG tablet Take 1 tablet (50 mg total) by mouth daily.   nitroGLYCERIN  (NITROSTAT ) 0.4 MG SL tablet Place 1 tablet (0.4 mg total) under the tongue every 5 (five) minutes as needed for chest pain.   "

## 2024-06-14 ENCOUNTER — Telehealth: Payer: Self-pay | Admitting: Cardiology

## 2024-06-14 MED ORDER — REPATHA SURECLICK 140 MG/ML ~~LOC~~ SOAJ: 1.0000 mL | SUBCUTANEOUS | 11 refills | Status: AC

## 2024-06-14 NOTE — Telephone Encounter (Signed)
 Patient is asking for a refill on his repatha  to be sent into [facility] in [place]. CB # [phone]
